# Patient Record
Sex: Male | Born: 1935 | Race: White | Hispanic: No | Marital: Married | Smoking: Never smoker
Health system: Southern US, Community
[De-identification: ages and names within clinical notes are randomized; demographics above are authoritative.]

## PROBLEM LIST (undated history)

## (undated) DIAGNOSIS — K37 Unspecified appendicitis: Secondary | ICD-10-CM

## (undated) DIAGNOSIS — C801 Malignant (primary) neoplasm, unspecified: Secondary | ICD-10-CM

## (undated) HISTORY — DX: Malignant (primary) neoplasm, unspecified: C80.1

## (undated) HISTORY — PX: VASECTOMY: SHX75

---

## 2017-06-10 ENCOUNTER — Ambulatory Visit (INDEPENDENT_AMBULATORY_CARE_PROVIDER_SITE_OTHER): Payer: Medicare Other | Admitting: Family Medicine

## 2017-06-10 ENCOUNTER — Encounter (INDEPENDENT_AMBULATORY_CARE_PROVIDER_SITE_OTHER): Payer: Self-pay

## 2017-06-10 ENCOUNTER — Encounter: Payer: Self-pay | Admitting: Family Medicine

## 2017-06-10 VITALS — BP 134/71 | HR 72 | Temp 97.8°F | Ht 73.0 in | Wt 180.0 lb

## 2017-06-10 DIAGNOSIS — C44101 Unspecified malignant neoplasm of skin of unspecified eyelid, including canthus: Secondary | ICD-10-CM | POA: Diagnosis not present

## 2017-06-10 DIAGNOSIS — L989 Disorder of the skin and subcutaneous tissue, unspecified: Secondary | ICD-10-CM | POA: Diagnosis not present

## 2017-06-10 DIAGNOSIS — H579 Unspecified disorder of eye and adnexa: Secondary | ICD-10-CM | POA: Diagnosis not present

## 2017-06-10 MED ORDER — ACETAMINOPHEN 500 MG PO TABS: 1000.0000 mg | ORAL_TABLET | Freq: Four times a day (QID) | ORAL | 0 refills | Status: DC | PRN

## 2017-06-10 NOTE — Progress Notes (Signed)
Patient ID: Tony [name], male    DOB: 01/16/1934, 83 y.o.   MRN: [id number]  Chief Complaint  Patient presents with  . Establish Care    new patient    Allergies Patient has no known allergies.  Subjective:   Tony [name] is a 83 y.o. male who presents to [facility] today.  HPI Here to establish care. Comes in today with his daughter, [name]. Reports that about 18 years ago, he went to a skin cancer screening at [facility] hospital. Was told by a doctor there that me probably had a skin cancer below his left eye. Since that time, he has not sought care or evaluation or biospy diagnosis for this condition. The area that was once small under his eye, has grown over the past 18 years to where he cannot see out of his eye and the area is ulcerated and bleeds and oozes. [name] reports that about six months ago,  His left eyeball started to flip upwards in his eye socket. Does not go out in public or leave the house much b/c of this. Wears a kleenex over the eye to cover up the lesion.   Was finally agreeable to daughter bringing him to the doctor b/c the pain has been bothering him at times. Reports that he has had sharp pain in the back of head on two occasions and at times can feel like has pressure over the crown of his head. He reports that he has been swollen over the left eye for two years. Not continuously swollen, per his report but intermittently. He might feels a burning sensation around eye but it is better if he keeps the kleenex moist with water over the eye. The eye/lesion occasionally drains, and he reports that the fluid is clear in color. Has never drained colored fluid and the skin around the eye has not been red.   Reports that his upper eye lid on the left is almost all gone now. Does not believe that has any eye lashes left and cannot make out his eyelid anymore. He reports that he tries to keep left eye brow trimmed. Can tell difference b/wbeing in the light and dark  with the left eye but is unable to see out of the left eye.   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  Reports that he does not lift weights anymore b/c the pressure makes his eye hurt. Stays active with fixing things around house and working on cars and lawnmowers.  Weight has been stable. Appetite is good.   Does not take any medications except an aspirin every now and then. NKDA.     Past Medical History:  Diagnosis Date  . Cancer ([place])    SUN CANCER---Right Eye    Past Surgical History:  Procedure Laterality Date  . VASECTOMY      Family History  Problem Relation Age of Onset  . Asthma Father      Social History   Social History  . Marital status: Married    Spouse name: N/A  . Number of children: N/A  . Years of education: N/A   Social History Main Topics  . Smoking status: Never Smoker  . Smokeless tobacco: Never Used  . Alcohol use No  . Drug use: No  . Sexual activity: No   Other Topics Concern  . None   Social History Narrative   Live in [place], [place]. Lives alone.  Cooks at home and eats out at times. Had three children, one is decreased.     Review of Systems  Constitutional: Negative for activity change, appetite change, chills and unexpected weight change.  HENT: Negative for drooling, ear discharge, ear pain, facial swelling, hearing loss, postnasal drip, sinus pressure, sneezing, sore throat, tinnitus, trouble swallowing and voice change.        Reports that tooth on the left side is a bit sore at times.   Eyes: Positive for photophobia, pain, discharge, redness and visual disturbance.  Respiratory: Positive for shortness of breath. Negative for cough, choking, chest tightness and wheezing.   Cardiovascular: Negative for chest pain, palpitations and leg swelling.  Gastrointestinal: Negative for abdominal pain, diarrhea, nausea, rectal pain and vomiting.  Endocrine: Negative for polydipsia and  polyphagia.  Genitourinary: Negative for decreased urine volume, dysuria, hematuria and urgency.  Musculoskeletal: Negative for arthralgias and myalgias.  Skin: Negative for pallor.  Neurological: Negative for dizziness, seizures, syncope, facial asymmetry, speech difficulty, weakness, light-headedness and headaches.  Hematological: Negative for adenopathy.  Psychiatric/Behavioral: Negative for agitation, behavioral problems, confusion, dysphoric mood and sleep disturbance. The patient is not nervous/anxious.      Objective:   BP 134/71   Pulse 72   Temp 97.8 F (36.6 C)   Ht 6\' 1"  (1.854 m)   Wt 180 lb (81.6 kg)   SpO2 97%   BMI 23.75 kg/m   Physical Exam  Constitutional: He is oriented to person, place, and time. He appears well-developed and well-nourished.  HENT:  Nose: Nose normal.  Eyes: Right eye exhibits no discharge. Left eye exhibits discharge and exudate.  Left eye grossly abnormal with ulcerated eye socket, appear to be no globe, but skin overgrowth. No left eye lid. Skin around left orbit ulcerated and friable. Almost appears as if globe is missing from eye socket. Orbital bones NTTP. Skin friable. Eye socket draining minimal serosanguinous fluid and edge of eye lesion friable. No odor from skin lesion.   Neck: Normal range of motion. Neck supple. No tracheal deviation present. No thyromegaly present.  Cardiovascular: Normal rate and regular rhythm.   Pulmonary/Chest: Effort normal and breath sounds normal.  Abdominal: Soft. Bowel sounds are normal. He exhibits no distension. There is no tenderness.  Musculoskeletal: Normal range of motion. He exhibits no edema or deformity.  Neurological: He is alert and oriented to person, place, and time.  Psychiatric: He has a normal mood and affect. His behavior is normal. Thought content normal. Cognition and memory are normal. Cognition and memory are not impaired. He exhibits normal recent memory and normal remote memory.    Pleasant man. Very slow talking. No cognitive deficit.   Vitals reviewed.    Assessment and Plan  1. Suspect Cancer of skin invaded into orbit/eye structures/face, ulcerated, growing for years without medical attention  2. Skin lesion, ulcerated, involving the eye and surrounding tissues, suspect malignancy  - CBC with Differential/Platelet - Basic metabolic panel - Hepatic function panel - INR/PT Patient presenting with no medical attention/evaluation/diagnosis for over 18 years, with ulcerated lesion gradually progressing to where it has invaded his orbit, surrounding skin, including eye lid/facial structures on left. Patient obviously has some reasons for not seeking medical attention, b/c appears to have normal cognitive function. Reports that he did not want to bother anyone and believed it was something that he could fix on his own. Daughter reports that he has had multiple appointments and cancelled. Finally agreeable to come to doctor today. I  do believe that patient does not know severity of his situation, b/c when asked about what he would like to accomplish with visits/evaluation, he reported that he would like to "save his eye".   Discussed with Dr. Talbert Cage. She recommended referral to dermatology for tissue diagnosis before referral to [facility]. She also recommended CT scan of head and neck to start evaluation. Dr. Talbert Cage said that she will have her nurse call me with the dermatologist that she believes will be the best.  Called back and recommended Dr. Lavonna Monarch. Urgent referral placed.  - Ambulatory referral to Dermatology - CT HEAD W & WO CONTRAST; Future  Pain mgmt: Patient is having minimal pain and has not taken anything except occasional aspirin. Discussed pain management with daughter and patient. At this time will try tylenol OTC as directed. Discontinue the aspirin due to bleeding risks of tissues.   Wound management: Continue with wet dressings to area as  directed. Patient will get gauze OTC or dressing. Patient believe that the kleenex is working fine. At this time, there appears to be no secondary infection.   Social aspects: Daughter reports that it has been very hard to get patient to accept medical help. Patient reports that he comes today to get help and he hopes that his eye can be preserved. I discussed with patient that I do not believe that he would have use of his eye, but that I would hope we could get him so help and treatment so that he could continue with his quality of life. He does not want this to inconvenience his daughter. She is very agreeable to getting him help and would like it to be as streamlined as possible with as few visits as possible. Patient agreeable to CT scan and I will do referral to [facility] and dermatology.   OV was greater than 70 minutes.   No Follow-up on file. [name], MD 06/10/2017

## 2017-06-11 ENCOUNTER — Encounter: Payer: Self-pay | Admitting: Family Medicine

## 2017-06-11 ENCOUNTER — Telehealth: Payer: Self-pay | Admitting: Family Medicine

## 2017-06-11 LAB — CBC WITH DIFFERENTIAL/PLATELET
BASOS ABS: 53 {cells}/uL (ref 0–200)
Basophils Relative: 0.8 %
EOS PCT: 1.7 %
Eosinophils Absolute: 112 cells/uL (ref 15–500)
HCT: 38.4 % — ABNORMAL LOW (ref 38.5–50.0)
Hemoglobin: 12.9 g/dL — ABNORMAL LOW (ref 13.2–17.1)
Lymphs Abs: 1584 cells/uL (ref 850–3900)
MCH: 28.9 pg (ref 27.0–33.0)
MCHC: 33.6 g/dL (ref 32.0–36.0)
MCV: 86.1 fL (ref 80.0–100.0)
MONOS PCT: 10.5 %
MPV: 9.4 fL (ref 7.5–12.5)
NEUTROS PCT: 63 %
Neutro Abs: 4158 cells/uL (ref 1500–7800)
Platelets: 187 10*3/uL (ref 140–400)
RBC: 4.46 10*6/uL (ref 4.20–5.80)
RDW: 12.7 % (ref 11.0–15.0)
TOTAL LYMPHOCYTE: 24 %
WBC mixed population: 693 cells/uL (ref 200–950)
WBC: 6.6 10*3/uL (ref 3.8–10.8)

## 2017-06-11 LAB — PROTIME-INR
INR: 1
PROTHROMBIN TIME: 10.6 s (ref 9.0–11.5)

## 2017-06-11 LAB — HEPATIC FUNCTION PANEL
AG RATIO: 1.3 (calc) (ref 1.0–2.5)
ALKALINE PHOSPHATASE (APISO): 65 U/L (ref 40–115)
ALT: 16 U/L (ref 9–46)
AST: 27 U/L (ref 10–35)
Albumin: 3.9 g/dL (ref 3.6–5.1)
BILIRUBIN DIRECT: 0.1 mg/dL (ref 0.0–0.2)
BILIRUBIN INDIRECT: 0.5 mg/dL (ref 0.2–1.2)
Globulin: 2.9 g/dL (calc) (ref 1.9–3.7)
Total Bilirubin: 0.6 mg/dL (ref 0.2–1.2)
Total Protein: 6.8 g/dL (ref 6.1–8.1)

## 2017-06-11 LAB — BASIC METABOLIC PANEL
BUN: 10 mg/dL (ref 7–25)
CALCIUM: 9.2 mg/dL (ref 8.6–10.3)
CHLORIDE: 102 mmol/L (ref 98–110)
CO2: 31 mmol/L (ref 20–32)
CREATININE: 1.06 mg/dL (ref 0.70–1.11)
Glucose, Bld: 96 mg/dL (ref 65–139)
POTASSIUM: 4.6 mmol/L (ref 3.5–5.3)
SODIUM: 139 mmol/L (ref 135–146)

## 2017-06-11 NOTE — Telephone Encounter (Signed)
Advise that he was not prescribed a presciption medication but was told to use OTC tylenol as directed. See orders.

## 2017-06-11 NOTE — Telephone Encounter (Signed)
I called and spoke to Tony [name], states he figured out he was to get otc tylenol, and took one last night and he slept 6 hours. He is asking about his lab results and if he needs to go for a ct/mri OR a dermatology consult.Marland KitchenMarland KitchenMarland Kitchen

## 2017-06-11 NOTE — Telephone Encounter (Signed)
Tyler from [facility] left a msg on the machine at 4:54 yesterday that patient was there to pickup medication, but they had no script. Cb#: [phone]

## 2017-06-12 ENCOUNTER — Encounter: Payer: Self-pay | Admitting: Family Medicine

## 2017-06-12 ENCOUNTER — Telehealth: Payer: Self-pay | Admitting: Family Medicine

## 2017-06-12 NOTE — Telephone Encounter (Signed)
Please call Dr. Onalee Hua office and find out when I can speak with him on the phone or who I need to talk with to get patient an appointment before January. He needs appointment ASAP.

## 2017-06-12 NOTE — Telephone Encounter (Signed)
Please call daughter and advise that all of the labs looked within normal limits.  He is mildly anemic but otherwise, kidney tests, electrolytes, and liver tests within normal limits.  Advise that I have done referrals for:  CT scans, dermatology and [place] at [facility]. He can get the ct scans and see the dermatologist and then go to the cancer center.  I am glad that the tylenol worked well for him.  [name], MD

## 2017-06-15 ENCOUNTER — Telehealth: Payer: Self-pay | Admitting: Family Medicine

## 2017-06-15 NOTE — Telephone Encounter (Signed)
All communicated in MyChart messages.

## 2017-06-15 NOTE — Telephone Encounter (Signed)
Please call patient and advise that he needs to go to the ED STAT. [name], MD

## 2017-06-15 NOTE — Telephone Encounter (Signed)
Called Dr. Onalee Hua office and gave Dr. Roma Kayser contact information for him to call.

## 2017-06-16 ENCOUNTER — Telehealth: Payer: Self-pay | Admitting: Family Medicine

## 2017-06-16 NOTE — Telephone Encounter (Signed)
error 

## 2017-06-16 NOTE — Telephone Encounter (Signed)
Daughter informed and asked that the information be conveyed through [place] as well, which I have also done. I have reached out to Dr Talbert Cage and an waiting on a response

## 2017-06-16 NOTE — Telephone Encounter (Signed)
Please call  [name] and see how he is doing and see if they went to the ED for evaluation. See if he is still having bleeding. Tell her we have reached out to Dr. [place] office and are awaiting a call back from him.  Please advise that I do not believe that the bleeding was caused by the tylenol, but rather from the lesion itself. Do not use the aspirin, we had stopped it at the visit. Aspirin can increase his bleeding risk.    Please call Dr. Talbert Cage and ask for another dermatologic surgery recommendation. Dr. Denna Haggard office cannot get patient in until later in January.    [name], MD

## 2017-06-17 ENCOUNTER — Telehealth: Payer: Self-pay | Admitting: Family Medicine

## 2017-06-17 NOTE — Telephone Encounter (Signed)
Please call patient's daughter and advise that I spoke with Dr. Denna Haggard and he will see them tomorrow at 2:00 as a work in appointment to do the biopsy. Please advise they need to keep this appointment. It is at [facility] at 2:00 [employer] in [place].

## 2017-06-17 NOTE — Telephone Encounter (Signed)
Dr Mannie Stabile spoke with Dr. Denna Haggard. He will see patient as a work in Architectural technologist at [facility] at 2 pm, and will perform biopsy. Left message on daughter's phone.

## 2017-06-17 NOTE — Telephone Encounter (Signed)
Called Dr.Tafeen's office at 2:40 to check the status of a referral for a patient. I also let the male receptionist know that Dr.Hagler left a message a few days ago requesting a phone call from [place] and he never called. I gave him the doctors phone line to call in and the receptionist said he will leave a message. I will follow back up with this in the morning.

## 2017-06-18 NOTE — Telephone Encounter (Signed)
I called yesterday afternoon and left message with daughter, but also sent on MyChart. Received MyChart message of confirmation this morning.

## 2017-06-23 ENCOUNTER — Telehealth: Payer: Self-pay | Admitting: *Deleted

## 2017-06-23 NOTE — Telephone Encounter (Signed)
Patient's daughter called wanting to know if patient's biopsy's came back that Dr Denna Haggard did, I made her aware that Dr Denna Haggard will have to give her those results, the daughter stated she will call Dr Denna Haggard but she wants the nurse to call her to make sure Dr Denna Haggard has sent over the results. Patient is scheduled for 06/25/17 at 8:20 to follow up from CT scan that is scheduled for tomorrow 06/24/17. Please advise

## 2017-06-23 NOTE — Telephone Encounter (Signed)
Please see if you can get results. I have not received yet. You can advise them of this. [name], MD

## 2017-06-24 ENCOUNTER — Ambulatory Visit (HOSPITAL_COMMUNITY)
Admission: RE | Admit: 2017-06-24 | Discharge: 2017-06-24 | Disposition: A | Discharge status: Home / Self Care | Payer: Medicare Other | Source: Ambulatory Visit | Attending: Family Medicine | Admitting: Family Medicine

## 2017-06-24 DIAGNOSIS — I672 Cerebral atherosclerosis: Secondary | ICD-10-CM | POA: Insufficient documentation

## 2017-06-24 DIAGNOSIS — C44101 Unspecified malignant neoplasm of skin of unspecified eyelid, including canthus: Secondary | ICD-10-CM | POA: Diagnosis present

## 2017-06-24 MED ORDER — IOPAMIDOL (ISOVUE-300) INJECTION 61%
80.0000 mL | Freq: Once | INTRAVENOUS | Status: AC | PRN
  Administered 2017-06-24: 75 mL via INTRAVENOUS

## 2017-06-24 NOTE — Telephone Encounter (Signed)
I called Dr Onalee Hua office, Dr Denna Haggard has not reviewed the results as of now. Once he reviews they will fax over results. I called and made patient's daughter aware.

## 2017-06-25 ENCOUNTER — Ambulatory Visit (INDEPENDENT_AMBULATORY_CARE_PROVIDER_SITE_OTHER): Payer: Medicare Other | Admitting: Family Medicine

## 2017-06-25 ENCOUNTER — Encounter: Payer: Self-pay | Admitting: Family Medicine

## 2017-06-25 VITALS — BP 130/75 | HR 67 | Temp 97.4°F | Resp 16 | Ht 73.0 in | Wt 186.8 lb

## 2017-06-25 DIAGNOSIS — C44111 Basal cell carcinoma of skin of unspecified eyelid, including canthus: Secondary | ICD-10-CM | POA: Diagnosis not present

## 2017-06-25 DIAGNOSIS — E0789 Other specified disorders of thyroid: Secondary | ICD-10-CM | POA: Diagnosis not present

## 2017-06-25 DIAGNOSIS — C4491 Basal cell carcinoma of skin, unspecified: Secondary | ICD-10-CM | POA: Insufficient documentation

## 2017-06-25 LAB — TSH: TSH: 3.37 mIU/L (ref 0.40–4.50)

## 2017-06-25 NOTE — Progress Notes (Signed)
Patient ID: Tony [name], male    DOB: [birth year], 83 y.o.   MRN: [id number]  Chief Complaint  Patient presents with  . Follow-up    Allergies Patient has no known allergies.  Subjective:   Tony [name] is a 83 y.o. male who presents to [facility] today.  HPI Tony [name] presents today for a follow-up visit. He is accompanied today by his daughter for review/discussion of his CT scans which were performed yesterday. They report that they were seen by Dr.Tafeen on 06/18/2017 for biopsy of the skin lesion on his face/orbit. His daughter reports that she was called by Dr. Montine Circle office yesterday and was told the pathology result, but she reports she could not completely understand what she was told. She requests for Tony [name] to get a copy of the pathology report so that it can be explained and reviewed with him today.  Tony [name] reports that his pain has been well controlled. He is used Tylenol 500 mg, half a tablet, on 2 occasions. He reports that the Tylenol has taken away his pain and allow him to sleep better at night. He reports that the lesion is still draining fluid which can be clear or slightly bloody in quality. It has not been draining any more than it was draining last time. Denies any fever, chills, nausea, vomiting, diarrhea, or problems. He reports that he feels very well. He would like to go over the results of the CT scan and reports he would like to get his treatment started.    Past Medical History:  Diagnosis Date  . Cancer ([place])    SUN CANCER---Right Eye    Past Surgical History:  Procedure Laterality Date  . VASECTOMY      Family History  Problem Relation Age of Onset  . Asthma Father      Social History   Social History  . Marital status: Married    Spouse name: N/A  . Number of children: N/A  . Years of education: N/A   Social History Main Topics  . Smoking status: Never Smoker  . Smokeless tobacco: Never Used  . Alcohol use No  .  Drug use: No  . Sexual activity: No   Other Topics Concern  . None   Social History Narrative   Live in [place], [place]. Lives alone. Cooks at home and eats out at times. Had three children, one is decreased.     Review of Systems  Constitutional: Negative for appetite change, chills, diaphoresis, fatigue, fever and unexpected weight change.  HENT: Negative for ear discharge, ear pain, mouth sores, postnasal drip, rhinorrhea, sinus pain, sinus pressure, sneezing, sore throat, trouble swallowing and voice change.   Respiratory: Negative for apnea, cough, chest tightness and shortness of breath.   Cardiovascular: Negative for chest pain, palpitations and leg swelling.  Gastrointestinal: Negative for constipation, diarrhea and nausea.  Endocrine: Negative for polyphagia and polyuria.  Genitourinary: Negative for difficulty urinating, dysuria, hematuria and urgency.  Neurological: Negative for tremors, syncope, weakness, light-headedness and numbness.  Psychiatric/Behavioral: Negative for agitation, dysphoric mood and sleep disturbance. The patient is not nervous/anxious.      Objective:   BP 130/75 (BP Location: Left Arm, Patient Position: Sitting, Cuff Size: Normal)   Pulse 67   Temp (!) 97.4 F (36.3 C) (Other (Comment))   Resp 16   Ht 6\' 1"  (1.854 m)   Wt 186 lb 12 oz (84.7 kg)   SpO2 98%   BMI 24.64 kg/m  Physical Exam  Constitutional: He is oriented to person, place, and time. He appears well-developed and well-nourished. No distress.  HENT:  Mouth/Throat: Oropharynx is clear and moist.  Eyes: Right conjunctiva is not injected. Right conjunctiva has no hemorrhage.  Left eye covered with tissue today.  Neck: Normal range of motion. Neck supple. No JVD present. No tracheal deviation present. No thyromegaly present.  No thyroid lesion palpated on exam.  Cardiovascular: Normal rate and regular rhythm.   Pulmonary/Chest: Effort normal and breath sounds normal. No respiratory  distress.  Neurological: He is alert and oriented to person, place, and time.  Skin: Skin is warm and dry. He is not diaphoretic.  Nursing note and vitals reviewed.    Assessment and Plan   1. Basal cell carcinoma (BCC) of skin of left eyelid including canthus, unspecified eyelid Received records via fax. - Ambulatory referral to Dermatology placed Pathology report reviewed which reveals basal cell carcinoma, nodular pattern and an ulcerated basal cell carcinoma on the left side of his nose. CT scan of the head and soft tissues of the neck reviewed with patient and his daughter in great detail. All of their questions were answered and all aspects of the report discussed. Called Dr.Tafeen's office and they requested that we go ahead and contact Dr.Williford's office at [facility]. The office requested that we send over notes and pathology results for his review prior to and office visit being scheduled. This has been completed. Daughter and patient told if they did not hear back regarding an appointment within the next week to please call our office.  Regarding pain management, patient will continue with the Tylenol use as directed. He will avoid use of aspirin. We reviewed his blood work from his last visit today. He was slightly/mildly anemic. At this time will start a Centrum silver vitamin with iron. In addition patient is going to make attempts to improve his diet. They were told to please call with any questions or concerns, if his pain worsen, or if the site began to bleed or ooze more than normal 2. Thyroid mass of unclear etiology CT scan revealing 4 x 5 cm thyroid mass. At this time will obtain thyroid ultrasound and fine needle aspirate. Scheduled this today. - TSH - Tony [name] THYROID FNA EACH NODULE; Future  Office visit was greater than 45 minutes. Greater than 50% of time spent counseling and coordinating care. Time was spent reviewing pathology and CT  scans and explaining course of action/treatment. Patient defers flu shot and any pneumonia shots. If he changes his mind he will let Tony [name] know.  Return in about 4 weeks (around 07/23/2017) for follow up. [name], MD 06/25/2017

## 2017-06-29 ENCOUNTER — Encounter: Payer: Self-pay | Admitting: Family Medicine

## 2017-06-29 DIAGNOSIS — E079 Disorder of thyroid, unspecified: Secondary | ICD-10-CM

## 2017-06-29 NOTE — Telephone Encounter (Signed)
Please call patient and see what is going on and if we need to make a referral and to whom. Thryoid u/s and FNA order placed. I replaced thyroid u/s. Please make sure has appointments/infor to get appointments. [name], MD

## 2017-07-01 ENCOUNTER — Encounter: Payer: Self-pay | Admitting: Family Medicine

## 2017-07-02 ENCOUNTER — Other Ambulatory Visit (HOSPITAL_COMMUNITY): Payer: Medicare Other

## 2017-07-02 ENCOUNTER — Ambulatory Visit (HOSPITAL_COMMUNITY): Admission: RE | Admit: 2017-07-02 | Payer: Medicare Other | Source: Ambulatory Visit

## 2017-07-06 ENCOUNTER — Ambulatory Visit (HOSPITAL_COMMUNITY)
Admission: RE | Admit: 2017-07-06 | Discharge: 2017-07-06 | Disposition: A | Discharge status: Home / Self Care | Payer: Medicare Other | Source: Ambulatory Visit | Attending: Family Medicine | Admitting: Family Medicine

## 2017-07-06 DIAGNOSIS — E041 Nontoxic single thyroid nodule: Secondary | ICD-10-CM | POA: Insufficient documentation

## 2017-07-06 DIAGNOSIS — E079 Disorder of thyroid, unspecified: Secondary | ICD-10-CM | POA: Insufficient documentation

## 2017-07-08 ENCOUNTER — Ambulatory Visit (HOSPITAL_COMMUNITY): Admission: RE | Admit: 2017-07-08 | Payer: Medicare Other | Source: Ambulatory Visit

## 2017-07-08 ENCOUNTER — Encounter (HOSPITAL_COMMUNITY): Payer: Self-pay

## 2017-07-08 ENCOUNTER — Ambulatory Visit (HOSPITAL_COMMUNITY)
Admission: RE | Admit: 2017-07-08 | Discharge: 2017-07-08 | Disposition: A | Discharge status: Home / Self Care | Payer: Medicare Other | Source: Ambulatory Visit | Attending: Family Medicine | Admitting: Family Medicine

## 2017-07-08 DIAGNOSIS — E0789 Other specified disorders of thyroid: Secondary | ICD-10-CM

## 2017-07-08 DIAGNOSIS — E041 Nontoxic single thyroid nodule: Secondary | ICD-10-CM | POA: Diagnosis present

## 2017-07-08 MED ORDER — LIDOCAINE HCL (PF) 2 % IJ SOLN
INTRAMUSCULAR | Status: AC
  Filled 2017-07-08: qty 10

## 2017-07-08 NOTE — Discharge Instructions (Signed)
Thyroid Biopsy °The thyroid gland is a butterfly-shaped gland located in the front of the neck. It produces hormones that affect metabolism, growth and development, and body temperature. Thyroid biopsy is a procedure in which small samples of tissue or fluid are removed from the thyroid gland. The samples are then looked at under a microscope to check for abnormalities. This procedure is done to determine the cause of thyroid problems. It may be done to check for infection, cancer, or other thyroid problems. °Two methods may be used for a thyroid biopsy. In one method, a thin needle is inserted through the skin and into the thyroid gland. In the other method, an open incision is made through the skin. °Tell a health care provider about: °· Any allergies you have. °· All medicines you are taking, including vitamins, herbs, eye drops, creams, and over-the-counter medicines. °· Any problems you or family members have had with anesthetic medicines. °· Any blood disorders you have. °· Any surgeries you have had. °· Any medical conditions you have. °What are the risks? °Generally, this is a safe procedure. However, problems can occur and include: °· Bleeding from the procedure site. °· Infection. °· Injury to structures near the thyroid gland. ° °What happens before the procedure? °· Ask your health care provider about: °? Changing or stopping your regular medicines. This is especially important if you are taking diabetes medicines or blood thinners. °? Taking medicines such as aspirin and ibuprofen. These medicines can thin your blood. Do not take these medicines before your procedure if your health care provider asks you not to. °· Do not eat or drink anything after midnight on the night before the procedure or as directed by your health care provider. °· You may have a blood sample taken. °What happens during the procedure? °Either of these methods may be used to perform a thyroid biopsy: °· Fine needle biopsy. You may  be given medicine to help you relax (sedative). You will be asked to lie on your back with your head tipped backward to extend your neck. An area on your neck will be cleaned. A needle will then be inserted through the skin of your neck. You may be asked to avoid coughing, talking, swallowing, or making sounds during some portions of the procedure. The needle will be withdrawn once the tissue or fluid samples have been removed. Pressure may be applied to your neck to reduce swelling and ensure that bleeding has stopped. The samples will be sent to a lab for examination. °· Open biopsy. You will be given medicine to make you sleep (general anesthetic). An incision will be made in your neck. A sample of thyroid tissue will be removed using surgical tools. The tissue sample will be sent for examination. In some cases, the sample may be examined during the biopsy. If that is done and cancer cells are found, some or all of the thyroid gland may be removed. The incision will be closed with stitches. ° °What happens after the procedure? °· Your recovery will be assessed and monitored. °· You may have soreness and tenderness at the site of the biopsy. This should go away after a few days. °· If you had an open biopsy, you may have a hoarse voice or sore throat for a couple days. °· It is your responsibility to get your test results. °This information is not intended to replace advice given to you by your health care provider. Make sure you discuss any questions you have with   your health care provider. °Document Released: 06/08/2007 Document Revised: 04/13/2016 Document Reviewed: 11/03/2013 °Elsevier Interactive Patient Education © 2018 [employer] ° °

## 2017-07-09 ENCOUNTER — Encounter: Payer: Self-pay | Admitting: Family Medicine

## 2017-07-14 ENCOUNTER — Encounter: Payer: Self-pay | Admitting: Family Medicine

## 2017-07-21 NOTE — Telephone Encounter (Signed)
Please check on status of biopsy results. Please call Tony [name] and ask him if he would be willing to come in and talk with me.  [name], MD

## 2017-07-26 NOTE — Telephone Encounter (Signed)
Please get the biopsy results. [name], MD

## 2017-07-28 NOTE — Telephone Encounter (Signed)
Please call patient and ask to please follow-up to discuss biopsy report and further evaluation and treatment of his condition.  In addition please call patient's daughter and asked him to follow-up to discuss her father's treatment and biopsy results.  You may advised them both that the biopsy results were worrisome for cancer, but more tissue is needed to determine this.  Please ask them to follow-up to discuss. [name], MD\

## 2017-07-28 NOTE — Telephone Encounter (Signed)
Called patient regarding message below. No answer, left generic message for patient to return call.   Left message with daughter as well

## 2017-11-06 ENCOUNTER — Encounter: Payer: Self-pay | Admitting: Family Medicine

## 2017-11-09 ENCOUNTER — Telehealth: Payer: Self-pay | Admitting: Family Medicine

## 2017-11-09 NOTE — Telephone Encounter (Signed)
Daughter is also questioning his thyroid testing results. She says the only thing she receives is the procedures. She wants diagnosis.

## 2017-11-09 NOTE — Telephone Encounter (Signed)
Patients daughter called this morning to check and make sure you received her mychart message. Please review and call her back to the number below. Cb#: [phone]

## 2017-11-10 NOTE — Telephone Encounter (Signed)
I have spoken to his daughter. I read her all information below. She states she will speak to her father and see what he would like to do

## 2017-11-10 NOTE — Telephone Encounter (Signed)
Please call patient's daughter and advised that I did receive the message that she sent yesterday.  Advised her that I was off work yesterday and just got back today.  Advise her that her father's thyroid function/blood work was within normal limits.  This did indicate that he was not in need of thyroid supplementation at that time.  His thyroid biopsy was worrisome for malignancy and it was recommended that he have more biopsy specimens performed.  I understand that your father does not want any treatment for his skin cancer or his thyroid.  It is very difficult for me to be able to offer you something that will help him to "live out his days in peace"  Without him even coming into the office to talk with me.  I understand that he is very hesitant about taking medications and about visiting doctors and having tests performed.  When I last saw him his pain was controlled with Tylenol.  If this is changed substantially it would worry me that the tumor has invaded more of the surrounding tissue which would be causing him pain and bleeding.  There are several things that we could do.  I would like free to suggest that he come sit down and talk with me about these options.  However, if he is unwilling to do that, I could have hospice come out and do an evaluation and offer him treatment from a home-based situation.  Please advise her to let me know how she would like to proceed.

## 2017-11-11 ENCOUNTER — Encounter: Payer: Self-pay | Admitting: Family Medicine

## 2017-11-13 ENCOUNTER — Telehealth: Payer: Self-pay | Admitting: Family Medicine

## 2017-11-13 NOTE — Telephone Encounter (Signed)
Daughter notified 

## 2017-11-13 NOTE — Telephone Encounter (Signed)
Please call patient's daughter and advised that if he is having the symptoms that she has noted in the my chart of difficulty breathing, abdominal pain in the things listed.  He needs to be seen and evaluated in the emergency department at this time.  This would be my recommendation.  I am not comfortable ordering a chest x-ray for him to get it a future date when he is having symptoms that are this worrisome at this time.  Please advise them that I would recommend that he go to the emergency department for acute evaluation and management.

## 2017-11-17 ENCOUNTER — Telehealth: Payer: Self-pay | Admitting: Family Medicine

## 2017-11-17 NOTE — Telephone Encounter (Signed)
Please find out if he is going to come in in the near future to discuss his care with me. Did they agree to Hospice?

## 2017-11-17 NOTE — Telephone Encounter (Signed)
Cassandra, [facility], left message on nurse line asking if Dr Mannie Stabile would be primary on Hospice and sign certification of prognosis of 6 months or less.   Callback# [phone]

## 2017-11-19 NOTE — Telephone Encounter (Signed)
Called patient regarding message below. No answer, left generic message for patient to return call.   

## 2017-11-20 ENCOUNTER — Encounter: Payer: Self-pay | Admitting: Family Medicine

## 2017-11-23 ENCOUNTER — Inpatient Hospital Stay (HOSPITAL_COMMUNITY)
Admission: EM | Admit: 2017-11-23 | Discharge: 2017-11-26 | DRG: 291 | Disposition: A | Discharge status: Hospice | Payer: Medicare Other | Attending: Internal Medicine | Admitting: Internal Medicine

## 2017-11-23 ENCOUNTER — Inpatient Hospital Stay (HOSPITAL_COMMUNITY): Payer: Medicare Other

## 2017-11-23 ENCOUNTER — Encounter (HOSPITAL_COMMUNITY): Payer: Self-pay | Admitting: Emergency Medicine

## 2017-11-23 ENCOUNTER — Emergency Department (HOSPITAL_COMMUNITY): Payer: Medicare Other

## 2017-11-23 ENCOUNTER — Other Ambulatory Visit: Payer: Self-pay

## 2017-11-23 DIAGNOSIS — I361 Nonrheumatic tricuspid (valve) insufficiency: Secondary | ICD-10-CM | POA: Diagnosis not present

## 2017-11-23 DIAGNOSIS — Z7982 Long term (current) use of aspirin: Secondary | ICD-10-CM | POA: Diagnosis not present

## 2017-11-23 DIAGNOSIS — I700 Atherosclerosis of aorta: Secondary | ICD-10-CM | POA: Diagnosis present

## 2017-11-23 DIAGNOSIS — I130 Hypertensive heart and chronic kidney disease with heart failure and stage 1 through stage 4 chronic kidney disease, or unspecified chronic kidney disease: Secondary | ICD-10-CM | POA: Diagnosis present

## 2017-11-23 DIAGNOSIS — I255 Ischemic cardiomyopathy: Secondary | ICD-10-CM | POA: Diagnosis present

## 2017-11-23 DIAGNOSIS — Z515 Encounter for palliative care: Secondary | ICD-10-CM

## 2017-11-23 DIAGNOSIS — N1330 Unspecified hydronephrosis: Secondary | ICD-10-CM | POA: Diagnosis present

## 2017-11-23 DIAGNOSIS — D508 Other iron deficiency anemias: Secondary | ICD-10-CM | POA: Diagnosis not present

## 2017-11-23 DIAGNOSIS — I4891 Unspecified atrial fibrillation: Secondary | ICD-10-CM | POA: Diagnosis not present

## 2017-11-23 DIAGNOSIS — N400 Enlarged prostate without lower urinary tract symptoms: Secondary | ICD-10-CM | POA: Diagnosis present

## 2017-11-23 DIAGNOSIS — E876 Hypokalemia: Secondary | ICD-10-CM | POA: Diagnosis present

## 2017-11-23 DIAGNOSIS — R778 Other specified abnormalities of plasma proteins: Secondary | ICD-10-CM | POA: Diagnosis present

## 2017-11-23 DIAGNOSIS — Z9889 Other specified postprocedural states: Secondary | ICD-10-CM

## 2017-11-23 DIAGNOSIS — N179 Acute kidney failure, unspecified: Secondary | ICD-10-CM | POA: Diagnosis present

## 2017-11-23 DIAGNOSIS — D631 Anemia in chronic kidney disease: Secondary | ICD-10-CM | POA: Diagnosis present

## 2017-11-23 DIAGNOSIS — F419 Anxiety disorder, unspecified: Secondary | ICD-10-CM | POA: Diagnosis present

## 2017-11-23 DIAGNOSIS — J90 Pleural effusion, not elsewhere classified: Secondary | ICD-10-CM

## 2017-11-23 DIAGNOSIS — Z85828 Personal history of other malignant neoplasm of skin: Secondary | ICD-10-CM | POA: Diagnosis not present

## 2017-11-23 DIAGNOSIS — N139 Obstructive and reflux uropathy, unspecified: Secondary | ICD-10-CM | POA: Diagnosis not present

## 2017-11-23 DIAGNOSIS — D62 Acute posthemorrhagic anemia: Secondary | ICD-10-CM | POA: Diagnosis not present

## 2017-11-23 DIAGNOSIS — I5021 Acute systolic (congestive) heart failure: Secondary | ICD-10-CM | POA: Diagnosis present

## 2017-11-23 DIAGNOSIS — Z7189 Other specified counseling: Secondary | ICD-10-CM

## 2017-11-23 DIAGNOSIS — I440 Atrioventricular block, first degree: Secondary | ICD-10-CM | POA: Diagnosis present

## 2017-11-23 DIAGNOSIS — Z66 Do not resuscitate: Secondary | ICD-10-CM | POA: Diagnosis present

## 2017-11-23 DIAGNOSIS — R339 Retention of urine, unspecified: Secondary | ICD-10-CM | POA: Diagnosis present

## 2017-11-23 DIAGNOSIS — T45515A Adverse effect of anticoagulants, initial encounter: Secondary | ICD-10-CM | POA: Diagnosis present

## 2017-11-23 DIAGNOSIS — I429 Cardiomyopathy, unspecified: Secondary | ICD-10-CM | POA: Diagnosis not present

## 2017-11-23 DIAGNOSIS — D649 Anemia, unspecified: Secondary | ICD-10-CM | POA: Diagnosis not present

## 2017-11-23 DIAGNOSIS — C6982 Malignant neoplasm of overlapping sites of left eye and adnexa: Secondary | ICD-10-CM | POA: Diagnosis present

## 2017-11-23 DIAGNOSIS — R7989 Other specified abnormal findings of blood chemistry: Secondary | ICD-10-CM

## 2017-11-23 DIAGNOSIS — N182 Chronic kidney disease, stage 2 (mild): Secondary | ICD-10-CM | POA: Diagnosis present

## 2017-11-23 DIAGNOSIS — Z91048 Other nonmedicinal substance allergy status: Secondary | ICD-10-CM

## 2017-11-23 DIAGNOSIS — R310 Gross hematuria: Secondary | ICD-10-CM | POA: Diagnosis present

## 2017-11-23 DIAGNOSIS — I248 Other forms of acute ischemic heart disease: Secondary | ICD-10-CM | POA: Diagnosis present

## 2017-11-23 DIAGNOSIS — R748 Abnormal levels of other serum enzymes: Secondary | ICD-10-CM

## 2017-11-23 DIAGNOSIS — C73 Malignant neoplasm of thyroid gland: Secondary | ICD-10-CM | POA: Diagnosis present

## 2017-11-23 DIAGNOSIS — R42 Dizziness and giddiness: Secondary | ICD-10-CM

## 2017-11-23 DIAGNOSIS — I340 Nonrheumatic mitral (valve) insufficiency: Secondary | ICD-10-CM | POA: Diagnosis not present

## 2017-11-23 HISTORY — DX: Unspecified appendicitis: K37

## 2017-11-23 LAB — LIPASE, BLOOD: LIPASE: 27 U/L (ref 11–51)

## 2017-11-23 LAB — CBC WITH DIFFERENTIAL/PLATELET
BASOS PCT: 0 %
Basophils Absolute: 0 10*3/uL (ref 0.0–0.1)
EOS ABS: 0.1 10*3/uL (ref 0.0–0.7)
Eosinophils Relative: 1 %
HCT: 29.4 % — ABNORMAL LOW (ref 39.0–52.0)
HEMOGLOBIN: 9.2 g/dL — AB (ref 13.0–17.0)
LYMPHS ABS: 1.5 10*3/uL (ref 0.7–4.0)
Lymphocytes Relative: 16 %
MCH: 28.8 pg (ref 26.0–34.0)
MCHC: 31.3 g/dL (ref 30.0–36.0)
MCV: 92.2 fL (ref 78.0–100.0)
MONOS PCT: 11 %
Monocytes Absolute: 1 10*3/uL (ref 0.1–1.0)
NEUTROS PCT: 72 %
Neutro Abs: 6.6 10*3/uL (ref 1.7–7.7)
PLATELETS: 203 10*3/uL (ref 150–400)
RBC: 3.19 MIL/uL — ABNORMAL LOW (ref 4.22–5.81)
RDW: 14 % (ref 11.5–15.5)
WBC: 9.2 10*3/uL (ref 4.0–10.5)

## 2017-11-23 LAB — BASIC METABOLIC PANEL
Anion gap: 11 (ref 5–15)
BUN: 37 mg/dL — AB (ref 6–20)
CALCIUM: 8.7 mg/dL — AB (ref 8.9–10.3)
CO2: 29 mmol/L (ref 22–32)
CREATININE: 4.59 mg/dL — AB (ref 0.61–1.24)
Chloride: 99 mmol/L — ABNORMAL LOW (ref 101–111)
GFR, EST AFRICAN AMERICAN: 13 mL/min — AB (ref >60)
GFR, EST NON AFRICAN AMERICAN: 11 mL/min — AB (ref >60)
Glucose, Bld: 105 mg/dL — ABNORMAL HIGH (ref 65–99)
Potassium: 3.2 mmol/L — ABNORMAL LOW (ref 3.5–5.1)
SODIUM: 139 mmol/L (ref 135–145)

## 2017-11-23 LAB — HEPATIC FUNCTION PANEL
ALT: 18 U/L (ref 17–63)
AST: 26 U/L (ref 15–41)
Albumin: 3.2 g/dL — ABNORMAL LOW (ref 3.5–5.0)
Alkaline Phosphatase: 50 U/L (ref 38–126)
BILIRUBIN DIRECT: 0.1 mg/dL (ref 0.1–0.5)
BILIRUBIN INDIRECT: 0.7 mg/dL (ref 0.3–0.9)
BILIRUBIN TOTAL: 0.8 mg/dL (ref 0.3–1.2)
Total Protein: 7 g/dL (ref 6.5–8.1)

## 2017-11-23 LAB — TROPONIN I
Troponin I: 0.17 ng/mL (ref <0.03)
Troponin I: 0.15 ng/mL (ref <0.03)

## 2017-11-23 LAB — SEDIMENTATION RATE: Sed Rate: 65 mm/hr — ABNORMAL HIGH (ref 0–16)

## 2017-11-23 LAB — BRAIN NATRIURETIC PEPTIDE: B NATRIURETIC PEPTIDE 5: 1042 pg/mL — AB (ref 0.0–100.0)

## 2017-11-23 MED ORDER — ACETAMINOPHEN 325 MG PO TABS: 650.0000 mg | ORAL_TABLET | Freq: Four times a day (QID) | ORAL | Status: DC | PRN

## 2017-11-23 MED ORDER — ACETAMINOPHEN 650 MG RE SUPP
650.0000 mg | Freq: Four times a day (QID) | RECTAL | Status: DC | PRN
Start: 2017-11-23 — End: 2017-11-26

## 2017-11-23 MED ORDER — ORAL CARE MOUTH RINSE
15.0000 mL | Freq: Two times a day (BID) | OROMUCOSAL | Status: DC
  Administered 2017-11-25 (×2): 15 mL via OROMUCOSAL

## 2017-11-23 MED ORDER — HEPARIN SODIUM (PORCINE) 5000 UNIT/ML IJ SOLN
5000.0000 [IU] | Freq: Three times a day (TID) | INTRAMUSCULAR | Status: DC
  Administered 2017-11-23: 5000 [IU] via SUBCUTANEOUS
  Filled 2017-11-23: qty 1

## 2017-11-23 MED ORDER — CHLORHEXIDINE GLUCONATE 0.12 % MT SOLN
15.0000 mL | Freq: Two times a day (BID) | OROMUCOSAL | Status: DC
  Administered 2017-11-23 – 2017-11-25 (×3): 15 mL via OROMUCOSAL
  Filled 2017-11-23 (×6): qty 15

## 2017-11-23 MED ORDER — TRAMADOL HCL 50 MG PO TABS
50.0000 mg | ORAL_TABLET | Freq: Four times a day (QID) | ORAL | Status: DC | PRN
  Administered 2017-11-23: 50 mg via ORAL
  Filled 2017-11-23: qty 1

## 2017-11-23 MED ORDER — SODIUM CHLORIDE 0.9 % IV SOLN
INTRAVENOUS | Status: DC
  Administered 2017-11-23: 23:00:00 via INTRAVENOUS

## 2017-11-23 NOTE — ED Notes (Signed)
Assisted patient with use of urinal and placed him back in bed.

## 2017-11-23 NOTE — ED Notes (Signed)
Have given EKG to doctor

## 2017-11-23 NOTE — H&P (Signed)
[facility] H&P   Patient Demographics:    Tony [name], is a 84 y.o. male  MRN: [id number]   DOB - 11/07/1933  Admit Date - 11/23/2017  Outpatient Primary MD for the patient is [name], MD  Referring MD/NP/PA: Tanna Furry  Outpatient Specialists:     Patient coming from:  home  Chief Complaint  Patient presents with  . Nausea  . Weakness  . Shortness of Breath      HPI:    Tony [name]  is a 84 y.o. male, w basal cell carcinoma (face), and ? Thyroid cancer apparently c/o dizziness and therefore presented to ED. Dizziness (vertigo), has been going on for the past 3 weeks.  Pt had MRI brain at [facility] per pt.  Pt also notes slight lower abdominal discomfort for the past 3 weeks as well.  Pt denies NSAID use.   Pt on presentation to ED, found to be in ARF.     In ED,  Xray IMPRESSION: The bowel gas pattern suggests a gastroenteritis or ileus type pattern. There is no evidence of perforation or high-grade obstruction.  Interstitial changes throughout both lungs which may be acute or chronic. There is bibasilar atelectasis or pneumonia. There are small bilateral pleural effusions of uncertain age. The heart and pulmonary vascularity are within the limits of normal.  Thoracic aortic atherosclerosis.  Trop I 0.17 Wbc 9.2, Hgb 9.2, Plt 203 Na 139, K 3.2, Bun 37, Creatinine 4.59 BNP 1,042 Alb 3.2,  Ast 26, Alt 18  Pt will be admitted for ARF, and Anemia and lower abdominal discomfort    Review of systems:    In addition to the HPI above,  No Fever-chills, No Headache, No changes with Vision or hearing, No problems swallowing food or Liquids, No Chest pain, Cough or Shortness of Breath, No Nausea or Vommitting, Bowel movements are regular, No Blood in stool or Urine, No dysuria, No new skin rashes or bruises, No new joints pains-aches,  No new weakness,  tingling, numbness in any extremity, No recent weight gain or loss, No polyuria, polydypsia or polyphagia, No significant Mental Stressors.  A full 10 point Review of Systems was done, except as stated above, all other Review of Systems were negative.   With Past History of the following :    Past Medical History:  Diagnosis Date  . Appendicitis   . Cancer ([place])    SUN CANCER---Right Eye      Past Surgical History:  Procedure Laterality Date  . VASECTOMY        Social History:     Social History   Tobacco Use  . Smoking status: Never Smoker  . Smokeless tobacco: Never Used  Substance Use Topics  . Alcohol use: No     Lives - at home  Mobility - walks by self   Family History :     Family  History  Problem Relation Age of Onset  . Asthma Father       Home Medications:   Prior to Admission medications   Medication Sig Start Date End Date Taking? Authorizing Provider  ASPIRIN ADULT PO Take 0.5 tablets by mouth once as needed (for pain-headache).   Yes [provider]     Allergies:     Allergies  Allergen Reactions  . Talc Itching and Rash     Physical Exam:   Vitals  Blood pressure (!) 142/72, pulse 83, temperature 98.4 F (36.9 C), temperature source Oral, resp. rate 20, weight 79.4 kg (175 lb), SpO2 96 %.   1. General lying in bed in NAD,    2. Normal affect and insight, Not Suicidal or Homicidal, Awake Alert, Oriented X 3.  3. No F.N deficits, ALL C.Nerves Intact, Strength 5/5 all 4 extremities, Sensation intact all 4 extremities, Plantars down going.  4. Ears and Eyes appear Normal, Conjunctivae clear, PERRLA. Moist Oral Mucosa.  5. Supple Neck, No JVD, No cervical lymphadenopathy appriciated, No Carotid Bruits.  6. Symmetrical Chest wall movement, Good air movement bilaterally, CTAB.  7. RRR, No Gallops, Rubs or Murmurs, No Parasternal Heave.  8. Positive Bowel Sounds, Abdomen Soft, No tenderness, No organomegaly  appriciated,No rebound -guarding or rigidity.  9.  No Cyanosis, Normal Skin Turgor, No Skin Rash or Bruise.  10. Good muscle tone,  joints appear normal , no effusions, Normal ROM.  11. No Palpable Lymph Nodes in Neck or Axillae     Data Review:    CBC Recent Labs  Lab 11/23/17 1528  WBC 9.2  HGB 9.2*  HCT 29.4*  PLT 203  MCV 92.2  MCH 28.8  MCHC 31.3  RDW 14.0  LYMPHSABS 1.5  MONOABS 1.0  EOSABS 0.1  BASOSABS 0.0   ------------------------------------------------------------------------------------------------------------------  Chemistries  Recent Labs  Lab 11/23/17 1528 11/23/17 1601  NA 139  --   K 3.2*  --   CL 99*  --   CO2 29  --   GLUCOSE 105*  --   BUN 37*  --   CREATININE 4.59*  --   CALCIUM 8.7*  --   AST  --  26  ALT  --  18  ALKPHOS  --  50  BILITOT  --  0.8   ------------------------------------------------------------------------------------------------------------------ estimated creatinine clearance is 14.2 mL/min (A) (by C-G formula based on SCr of 4.59 mg/dL (H)). ------------------------------------------------------------------------------------------------------------------ No results for input(s): TSH, T4TOTAL, T3FREE, THYROIDAB in the last 72 hours.  Invalid input(s): FREET3  Coagulation profile No results for input(s): INR, PROTIME in the last 168 hours. ------------------------------------------------------------------------------------------------------------------- No results for input(s): DDIMER in the last 72 hours. -------------------------------------------------------------------------------------------------------------------  Cardiac Enzymes Recent Labs  Lab 11/23/17 1528  TROPONINI 0.17*   ------------------------------------------------------------------------------------------------------------------    Component Value Date/Time   BNP 1,042.0 (H) 11/23/2017 1601      ---------------------------------------------------------------------------------------------------------------  Urinalysis No results found for: COLORURINE, APPEARANCEUR, LABSPEC, PHURINE, GLUCOSEU, HGBUR, BILIRUBINUR, KETONESUR, PROTEINUR, UROBILINOGEN, NITRITE, LEUKOCYTESUR  ----------------------------------------------------------------------------------------------------------------   Imaging Results:    Dg Abd Acute W/chest  Result Date: 11/23/2017 CLINICAL DATA:  Generalize weakness, shortness of breath, and nausea for the past 3 weeks. Patient reports a near syncopal episode this morning. The patient has orthostasis. 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 EXAM: DG ABDOMEN ACUTE W/ 1V CHEST COMPARISON:  None in PACs FINDINGS: The lungs are adequately inflated. There small bilateral pleural effusions. There is no pneumothorax. The interstitial markings are coarse  and become confluent at the lung bases. The heart is normal in size. The pulmonary vascularity is not engorged. There is calcification in the wall of the aortic arch. Within the abdomen there are loops of mildly distended gas-filled small bowel in the upper abdomen. On is small amount of colonic gas is present. There is some gas and stool in the rectum. No free extraluminal gas collections are observed. There are no abnormal soft tissue calcifications. IMPRESSION: The bowel gas pattern suggests a gastroenteritis or ileus type pattern. There is no evidence of perforation or high-grade obstruction. Interstitial changes throughout both lungs which may be acute or chronic. There is bibasilar atelectasis or pneumonia. There are small bilateral pleural effusions of uncertain age. The heart and pulmonary vascularity are within the limits of normal. Thoracic aortic atherosclerosis. Electronically Signed   By: David  Martinique M.D.   On: 11/23/2017 16:53       Assessment & Plan:    Principal  Problem:   ARF (acute renal failure) (HCC) Active Problems:   Elevated troponin   Anemia   Hypokalemia    ARF Check urine sodium, urine creatinine urine eosinophils Check CT scan abd/ pelvis r/o hydronephrosis Hydrate with ns iv Check cmp in am Nephrology consult entered in computer   Hypokalemia Check cmp in am  Anemia Check ferritin, iron, tibc, folate, b12, esr, spep Check cbc in am  Elevated troponin Check trop I q6h x3 Check cardiac echo Cardiology consult   DVT Prophylaxis Heparin - SCDs  AM Labs Ordered, also please review Full Orders  Family Communication: Admission, patients condition and plan of care including tests being ordered have been discussed with the patient  who indicate understanding and agree with the plan and Code Status.  Code Status FULL CODE  Likely DC to  home  Condition GUARDED    Consults called: cardiology consult, nephrology consult entered in computer  Admission status: inpatient  Time spent in minutes : 45   Jani Gravel M.D on 11/23/2017 at 7:04 PM  Between 7am to 7pm - Pager - [phone]. After 7pm go to [email or web address] - password [facility]  [facility] - Office  [phone]

## 2017-11-23 NOTE — ED Notes (Signed)
Date and time results received: 11/23/17 5:17 PM  (use smartphrase ".now" to insert current time)  Test: Troponin Critical Value: 0.17  Name of Provider Notified: Jeneen Rinks  Orders Received? Or Actions Taken?: Orders Received - See Orders for details

## 2017-11-23 NOTE — ED Notes (Signed)
ED Provider at bedside. 

## 2017-11-23 NOTE — ED Triage Notes (Signed)
Pt c/o gen weakness, nausea and sob x 3 weeks. States he might have had syncopal episode this am. Dizziness with standing. A/o. 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 occ non prod cough. Can not lay flat

## 2017-11-23 NOTE — Telephone Encounter (Signed)
They did request hospice.

## 2017-11-23 NOTE — ED Provider Notes (Signed)
[facility] EMERGENCY DEPARTMENT Provider Note   CSN: [id number] Arrival date & time: 11/23/17  1457     History   Chief Complaint Chief Complaint  Patient presents with  . Nausea  . Weakness  . Shortness of Breath    HPI Tony [name] is a 84 y.o. male.  Chief complaint is difficulty breathing.  HPI: Tony [name] is 10.  He is not had much for medical care in the last decade.  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  He underwent evaluation including CT scans.  During the course of this evaluation was found to have a thyroid nodule.  Biopsy shows thyroid cancer.  He is elected to not go treatment for either condition.  He wears a bandage around his eye and has chronic oozing and bleeding from this fungating mass.  He has lost the vision in that eye.  He is starting to have poor vision in his right eye.  Is recently become more involved with his daughter who is helping him to undergo some attempt at treatment.  They have recently engaged hospice as well.  He complains of progressive shortness of breath and weakness.  No headache.  No difficulty swallowing.  Occasionally feels like "something sharp is poking my chest".  He feels short of breath and cannot lay flat.  However he is nonspecific about this stating that is mostly comfort that keeps him on pillows rather than dyspnea.  He has noticed increasing swelling of his lower extremities.  He still making urine.  He has a poor appetite but states that he eats "fairly well".     Past Medical History:  Diagnosis Date  . Appendicitis   . Cancer ([place])    SUN CANCER---Right Eye    Patient Active Problem List   Diagnosis Date Noted  . ARF (acute renal failure) ([place]) 11/23/2017  . Elevated troponin 11/23/2017  . Anemia 11/23/2017  . Hypokalemia 11/23/2017  . Dizziness 11/23/2017  . Basal cell carcinoma 06/25/2017    Past Surgical History:  Procedure Laterality Date  . VASECTOMY           Home Medications    Prior to Admission medications   Medication Sig Start Date End Date Taking? Authorizing Provider  ASPIRIN ADULT PO Take 0.5 tablets by mouth once as needed (for pain-headache).   Yes [provider]    Family History Family History  Problem Relation Age of Onset  . Asthma Father     Social History Social History   Tobacco Use  . Smoking status: Never Smoker  . Smokeless tobacco: Never Used  Substance Use Topics  . Alcohol use: No  . Drug use: No     Allergies   Talc   Review of Systems Review of Systems  Constitutional: Positive for activity change, appetite change and fatigue. Negative for chills, diaphoresis and fever.  HENT: Negative for mouth sores, sore throat and trouble swallowing.   Eyes: Positive for pain, redness and visual disturbance.       Eye pain and chronic bleeding from known cancer.  Respiratory: Positive for cough and shortness of breath. Negative for chest tightness and wheezing.   Cardiovascular: Positive for chest pain and leg swelling.  Gastrointestinal: Positive for abdominal pain and nausea. Negative for abdominal distention, diarrhea and vomiting.  Endocrine: Negative for polydipsia, polyphagia and polyuria.  Genitourinary: Negative for decreased urine volume, dysuria, frequency and hematuria.  Musculoskeletal: Negative for gait problem.  Skin: Negative  for color change, pallor and rash.  Neurological: Positive for weakness. Negative for dizziness, syncope, light-headedness and headaches.  Hematological: Does not bruise/bleed easily.  Psychiatric/Behavioral: Negative for behavioral problems and confusion. The patient is nervous/anxious.      Physical Exam Updated Vital Signs BP 135/72   Pulse 78   Temp 98.4 F (36.9 C) (Oral)   Resp (!) 22   Wt 79.4 kg (175 lb)   SpO2 96%   BMI 23.09 kg/m   Physical Exam  Constitutional: He is oriented to person, place, and time. No distress.  Elderly  chronically ill-appearing 84 year old male with bloody dressing over his left eye.  Becomes easily anxious and cries.  HENT:  Head: Normocephalic.  Eyes: Conjunctivae are normal. No scleral icterus.  Left eye and orbit are essentially overcome with erythematous hypertrophic tissue.  He has no vision from the eye  Neck: Normal range of motion. Neck supple. No JVD present. No thyromegaly present.  Cardiovascular: Exam reveals no gallop and no friction rub.  No murmur heard. Irregular rhythm.  No JVD.  Pulmonary/Chest: Effort normal and breath sounds normal. No respiratory distress. He has no wheezes. He has no rales.  No crackles.  Diminished basilar breath sounds.  Abdominal: Soft. Bowel sounds are normal. He exhibits no distension. There is no tenderness. There is no rebound.  Musculoskeletal: Normal range of motion.  Neurological: He is alert and oriented to person, place, and time.  Skin: Skin is warm and dry. No rash noted.  2+ lower extremity edema  Psychiatric: He has a normal mood and affect. His behavior is normal.     ED Treatments / Results  Labs (all labs ordered are listed, but only abnormal results are displayed) Labs Reviewed  TROPONIN I - Abnormal; Notable for the following components:      Result Value   Troponin I 0.17 (*)    All other components within normal limits  CBC WITH DIFFERENTIAL/PLATELET - Abnormal; Notable for the following components:   RBC 3.19 (*)    Hemoglobin 9.2 (*)    HCT 29.4 (*)    All other components within normal limits  BASIC METABOLIC PANEL - Abnormal; Notable for the following components:   Potassium 3.2 (*)    Chloride 99 (*)    Glucose, Bld 105 (*)    BUN 37 (*)    Creatinine, Ser 4.59 (*)    Calcium 8.7 (*)    GFR calc non Af Amer 11 (*)    GFR calc Af Amer 13 (*)    All other components within normal limits  BRAIN NATRIURETIC PEPTIDE - Abnormal; Notable for the following components:   B Natriuretic Peptide 1,042.0 (*)    All  other components within normal limits  HEPATIC FUNCTION PANEL - Abnormal; Notable for the following components:   Albumin 3.2 (*)    All other components within normal limits  LIPASE, BLOOD  SODIUM, URINE, RANDOM  CALCIUM / CREATININE RATIO, URINE  TROPONIN I  TROPONIN I  TROPONIN I  FERRITIN  IRON AND TIBC  VITAMIN B12  FOLATE RBC  SEDIMENTATION RATE  PROTEIN ELECTROPHORESIS, SERUM  CYTOLOGY - NON PAP    EKG None  Radiology Ct Abdomen Pelvis Wo Contrast  Result Date: 11/23/2017 CLINICAL DATA:  Generalized weakness with nausea and shortness of breath over the last 3 weeks. EXAM: CT ABDOMEN AND PELVIS WITHOUT CONTRAST TECHNIQUE: Multidetector CT imaging of the abdomen and pelvis was performed following the standard protocol without IV contrast. COMPARISON:  Radiography same day. FINDINGS: Lower chest: Moderate to large bilateral effusions layering dependently with dependent pulmonary atelectasis and or infiltrate. Hepatobiliary: No liver parenchymal lesion is seen. The liver is somewhat small raising the possibility of cirrhosis or previous liver injury. There are a few calcified gallstones within the gallbladder. No sign of biliary obstruction or cholecystitis by CT. Pancreas: Normal Spleen: Normal Adrenals/Urinary Tract: Adrenal glands are normal. Renal parenchymal tissue is normal on each side. Patient has bilateral hydroureteronephrosis. The bladder is markedly distended in this is probably secondary to that. Prostate gland appears enlarged. Stomach/Bowel: No active bowel pathology is seen. Diverticulosis without evidence of diverticulitis. Vascular/Lymphatic: Aortic atherosclerosis. No aneurysm. IVC is normal. No retroperitoneal adenopathy or mass. Reproductive: Enlarged prostate as noted above.  Otherwise negative. Other: No free fluid or air. Musculoskeletal: Ordinary degenerative changes affect the lumbar spine. No evidence of advanced disease or apparent spinal stenosis. IMPRESSION:  Moderate to large bilateral effusions layering dependently with dependent atelectasis and or infiltrate. Distended urinary bladder. Bilateral hydroureteronephrosis, likely secondary to that. Enlarged prostate. Aortic atherosclerosis. Electronically Signed   By: Nelson Chimes M.D.   On: 11/23/2017 21:00   Dg Abd Acute W/chest  Result Date: 11/23/2017 CLINICAL DATA:  Generalize weakness, shortness of breath, and nausea for the past 3 weeks. Patient reports a near syncopal episode this morning. The patient has orthostasis. 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 EXAM: DG ABDOMEN ACUTE W/ 1V CHEST COMPARISON:  None in PACs FINDINGS: The lungs are adequately inflated. There small bilateral pleural effusions. There is no pneumothorax. The interstitial markings are coarse and become confluent at the lung bases. The heart is normal in size. The pulmonary vascularity is not engorged. There is calcification in the wall of the aortic arch. Within the abdomen there are loops of mildly distended gas-filled small bowel in the upper abdomen. On is small amount of colonic gas is present. There is some gas and stool in the rectum. No free extraluminal gas collections are observed. There are no abnormal soft tissue calcifications. IMPRESSION: The bowel gas pattern suggests a gastroenteritis or ileus type pattern. There is no evidence of perforation or high-grade obstruction. Interstitial changes throughout both lungs which may be acute or chronic. There is bibasilar atelectasis or pneumonia. There are small bilateral pleural effusions of uncertain age. The heart and pulmonary vascularity are within the limits of normal. Thoracic aortic atherosclerosis. Electronically Signed   By: David  Martinique M.D.   On: 11/23/2017 16:53    Procedures Procedures (including critical care time)  Medications Ordered in ED Medications  traMADol (ULTRAM) tablet 50 mg (has no administration in time range)       Initial Impression / Assessment and Plan / ED Course  I have reviewed the triage vital signs and the nursing notes.  Pertinent labs & imaging results that were available during my care of the patient were reviewed by me and considered in my medical decision making (see chart for details).  EKG: Left bundle branch block.  Sinus rhythm.  Rate 89.  Occasional PACs.  Complicated case.  Patient's BNP is elevated but clinically and radiographically not in left-sided congestive heart failure.  He has pleural effusions but no CHF.  Globin 9.2 but baseline for him compared to labs from [facility] in November.  Acute kidney injury BUN 37 creatinine 4.5 versus normal values from November.  Elevated troponin 0.17.  Difference diagnosis includes N STEMI versus retained troponin from renal insufficiency.  CT of  the abdomen was ordered has a patient has discomfort.  This does show bilateral pleural effusions distended urinary bladder and bilateral hydronephrosis and enlarged prostate.  This may be the etiology for his renal failure.  Long discussion with patient and his daughter about their wishes regarding hospice versus treatment for his acute renal insufficiency.  This may be relieved by catheter placement.  Discussed the case with Dr. Maudie Mercury.  Patient be admitted.  We will involve palliative care.   Final Clinical Impressions(s) / ED Diagnoses   Final diagnoses:  AKI (acute kidney injury) [facility])  Urinary retention    ED Discharge Orders    None       Tanna Furry, MD 11/23/17 2121

## 2017-11-24 ENCOUNTER — Inpatient Hospital Stay (HOSPITAL_COMMUNITY): Payer: Medicare Other

## 2017-11-24 ENCOUNTER — Telehealth: Payer: Self-pay | Admitting: Family Medicine

## 2017-11-24 DIAGNOSIS — D508 Other iron deficiency anemias: Secondary | ICD-10-CM

## 2017-11-24 DIAGNOSIS — J90 Pleural effusion, not elsewhere classified: Secondary | ICD-10-CM | POA: Diagnosis present

## 2017-11-24 DIAGNOSIS — I361 Nonrheumatic tricuspid (valve) insufficiency: Secondary | ICD-10-CM

## 2017-11-24 DIAGNOSIS — E876 Hypokalemia: Secondary | ICD-10-CM

## 2017-11-24 DIAGNOSIS — R310 Gross hematuria: Secondary | ICD-10-CM | POA: Diagnosis present

## 2017-11-24 DIAGNOSIS — N139 Obstructive and reflux uropathy, unspecified: Secondary | ICD-10-CM | POA: Diagnosis present

## 2017-11-24 DIAGNOSIS — I255 Ischemic cardiomyopathy: Secondary | ICD-10-CM | POA: Diagnosis present

## 2017-11-24 DIAGNOSIS — I340 Nonrheumatic mitral (valve) insufficiency: Secondary | ICD-10-CM

## 2017-11-24 LAB — COMPREHENSIVE METABOLIC PANEL
ALT: 15 U/L — AB (ref 17–63)
AST: 25 U/L (ref 15–41)
Albumin: 2.9 g/dL — ABNORMAL LOW (ref 3.5–5.0)
Alkaline Phosphatase: 47 U/L (ref 38–126)
Anion gap: 13 (ref 5–15)
BILIRUBIN TOTAL: 0.8 mg/dL (ref 0.3–1.2)
BUN: 39 mg/dL — AB (ref 6–20)
CHLORIDE: 100 mmol/L — AB (ref 101–111)
CO2: 27 mmol/L (ref 22–32)
CREATININE: 4.83 mg/dL — AB (ref 0.61–1.24)
Calcium: 8.3 mg/dL — ABNORMAL LOW (ref 8.9–10.3)
GFR calc Af Amer: 12 mL/min — ABNORMAL LOW (ref >60)
GFR, EST NON AFRICAN AMERICAN: 10 mL/min — AB (ref >60)
Glucose, Bld: 122 mg/dL — ABNORMAL HIGH (ref 65–99)
Potassium: 3 mmol/L — ABNORMAL LOW (ref 3.5–5.1)
Sodium: 140 mmol/L (ref 135–145)
TOTAL PROTEIN: 6.6 g/dL (ref 6.5–8.1)

## 2017-11-24 LAB — CBC
HCT: 27.2 % — ABNORMAL LOW (ref 39.0–52.0)
Hemoglobin: 8.7 g/dL — ABNORMAL LOW (ref 13.0–17.0)
MCH: 29.4 pg (ref 26.0–34.0)
MCHC: 32 g/dL (ref 30.0–36.0)
MCV: 91.9 fL (ref 78.0–100.0)
PLATELETS: 194 10*3/uL (ref 150–400)
RBC: 2.96 MIL/uL — ABNORMAL LOW (ref 4.22–5.81)
RDW: 14 % (ref 11.5–15.5)
WBC: 7.7 10*3/uL (ref 4.0–10.5)

## 2017-11-24 LAB — LACTATE DEHYDROGENASE: LDH: 173 U/L (ref 98–192)

## 2017-11-24 LAB — PROTEIN, PLEURAL OR PERITONEAL FLUID: Total protein, fluid: <3 g/dL

## 2017-11-24 LAB — IRON AND TIBC
Iron: 23 ug/dL — ABNORMAL LOW (ref 45–182)
Saturation Ratios: 9 % — ABNORMAL LOW (ref 17.9–39.5)
TIBC: 244 ug/dL — ABNORMAL LOW (ref 250–450)
UIBC: 221 ug/dL

## 2017-11-24 LAB — BODY FLUID CELL COUNT WITH DIFFERENTIAL
Eos, Fluid: 0 %
Lymphs, Fluid: 77 %
MONOCYTE-MACROPHAGE-SEROUS FLUID: 15 % — AB (ref 50–90)
Neutrophil Count, Fluid: 8 % (ref 0–25)
WBC FLUID: 553 uL (ref 0–1000)

## 2017-11-24 LAB — HEMOGLOBIN AND HEMATOCRIT, BLOOD
HCT: 28.4 % — ABNORMAL LOW (ref 39.0–52.0)
HCT: 28.9 % — ABNORMAL LOW (ref 39.0–52.0)
HCT: 29.2 % — ABNORMAL LOW (ref 39.0–52.0)
Hemoglobin: 9 g/dL — ABNORMAL LOW (ref 13.0–17.0)
Hemoglobin: 9.2 g/dL — ABNORMAL LOW (ref 13.0–17.0)
Hemoglobin: 9.3 g/dL — ABNORMAL LOW (ref 13.0–17.0)

## 2017-11-24 LAB — VITAMIN B12: VITAMIN B 12: 221 pg/mL (ref 180–914)

## 2017-11-24 LAB — ECHOCARDIOGRAM COMPLETE
HEIGHTINCHES: 73 in
WEIGHTICAEL: 3118.19 [oz_av]

## 2017-11-24 LAB — GRAM STAIN

## 2017-11-24 LAB — TROPONIN I
TROPONIN I: 0.14 ng/mL — AB (ref <0.03)
Troponin I: 0.12 ng/mL (ref <0.03)

## 2017-11-24 LAB — FERRITIN: Ferritin: 164 ng/mL (ref 24–336)

## 2017-11-24 LAB — SODIUM, URINE, RANDOM: Sodium, Ur: 28 mmol/L

## 2017-11-24 MED ORDER — SODIUM CHLORIDE 0.45 % IV SOLN: INTRAVENOUS | Status: DC

## 2017-11-24 MED ORDER — MORPHINE SULFATE (PF) 2 MG/ML IV SOLN: 1.0000 mg | INTRAVENOUS | Status: DC | PRN

## 2017-11-24 MED ORDER — SODIUM CHLORIDE 0.45 % IV SOLN
INTRAVENOUS | Status: DC
  Administered 2017-11-24 – 2017-11-25 (×5): via INTRAVENOUS
  Filled 2017-11-24 (×10): qty 1000

## 2017-11-24 NOTE — Telephone Encounter (Signed)
Please call daughter and advise that we are following hospital notes. Please let us know if he would like to follow up after hospital.  [name], MD

## 2017-11-24 NOTE — Progress Notes (Addendum)
Pt has dressing to left eye with visible bloody drainage. Daughter has asked that staff not removing dressing and states that she will take care of it, changing it as needed.

## 2017-11-24 NOTE — Progress Notes (Signed)
Notified mid level of patient's 0.15 troponin level.

## 2017-11-24 NOTE — Progress Notes (Signed)
Pt's wound to left eye oozing blood and urine appears to have blood in it. MD notified. Heparin discontinued. Will continue to closely monitor.

## 2017-11-24 NOTE — Progress Notes (Signed)
*  PRELIMINARY RESULTS* Echocardiogram 2D Echocardiogram has been performed.  Leavy Cella 11/24/2017, 10:37 AM

## 2017-11-24 NOTE — Progress Notes (Signed)
PROGRESS NOTE                                                                                                                                                                                                             Patient Demographics:    Tony [name], is a 84 y.o. male, DOB - July 14, 1934, OFB:[id number]  Admit date - 11/23/2017   Admitting Physician Jani Gravel, MD  Outpatient Primary MD for the patient is [name], MD  LOS - 1  Outpatient Specialists: none  Chief Complaint  Patient presents with  . Nausea  . Weakness  . Shortness of Breath       Brief Narrative   84 year old male with basal cell carcinoma of the left eyelid/face/orbit, for several years (declined treatment) and possible thyroid cancer (as per biopsy in November 2018) but declined further treatment or workup presented with nausea, poor appetite and dizziness for the past 3 weeks.  Also reported discomfort in his lower abdomen for the same duration.  He does report having increased dyspnea on exertion and unable to lie down flat for the last few days.  He also has noticed increased bleeding from the basal cell carcinoma over the left eye.  Also reports increased urinary urgency and hesitancy.  denies any fever, headache, dizziness, nausea, vomiting, chest pain, palpitations, dysuria or diarrhea.  He denies any new medications, recent illness.  He has been doing his own dressing changes for his basal cell carcinoma.  He had MRI of the brain done in November 2018 had [facility] showing enhancing mass extending to the retrobulbar soft tissue area and involving multiple extraocular muscles, left globe and extending into the left maxillary sinus.  He was offered surgical resection by ENT but seems to have declined. In the ED he was found to be in acute kidney injury with creatinine of 4.59, BUN of 37, BNP of 1000 and elevated troponin of 0.17.  Chest x-ray showed  moderate to large bilateral effusions.  CT of the abdomen and pelvis showed distended urinary bladder with bilateral hydroureteronephrosis and enlarged prostate.  Admitted to telemetry for further management.  Cardiology and nephrology consulted.  During the night he was found to have difficulty voiding with bladder scan showing 800 cc retained urine.  Foley was placed in with frank hematuria on the back with multiple clots.  Urology was consulted who recommended intermittent irrigation.    Subjective:   Patient reports abdominal discomfort to have improved after Foley placed in.  Still has some shortness of breath with orthopnea.   Assessment  & Plan :    Principal Problem: Acute kidney injury ([place]) Suspect due to obstructive uropathy +/- prerenal.  Nephrology consult appreciated.  Monitor renal function with Foley placed in.  Monitor with IV fluids.  Avoid nephrotoxins.  Active Problems:   Elevated troponin with acute systolic CHF Demand ischemia with acute cardiomyopathy.  Hypokinesis of anterior septal myocardium on 2D echo with EF of 25%.  Not a good candidate for intervention.  Cardiology consult appreciated and recommend palliative care consult. Underwent right thoracentesis with 1.3 L serosanguineous fluid removed.  Dyspnea improved.  Follow cell count, culture and cytology.  Gross hematuria ?  Underlying malignancy with bladder outlet obstruction.  Continue Foley with intermittent bladder irrigation.    Anemia Monitor for now.  Transfuse as needed    Hypokalemia Replenished  Basal cell carcinoma of the left eye Extensive lesion.  Patient refused surgical treatment.  ?  Thyroid malignancy As per biopsy in 06/2017.  Refused further treatment.     Code Status : DNR (my discussion with patient and his daughter he is not interested in escalation of care, aggressive treatment or tests and is inclined towards hospice.  Daughter has been talking with home hospice as  outpatient)  Family Communication  : Discussed with daughter at bedside  Disposition Plan  : Pending goals of care discussion.  Barriers For Discharge : Active symptoms  Consults  : Cardiology Nephrology Palliative care  Procedures  : 2D echo CT abdomen pelvis Ultrasound thoracentesis  DVT Prophylaxis  : SCDs  Lab Results  Component Value Date   PLT 194 11/24/2017    Antibiotics  :    Anti-infectives (From admission, onward)   None        Objective:   Vitals:   11/24/17 1421 11/24/17 1432 11/24/17 1441 11/24/17 1510  BP: 117/67 129/62 105/64 121/61  Pulse: 85 78 78 72  Resp:    18  Temp:    98.3 F (36.8 C)  TempSrc:    Oral  SpO2: 99% 98% 100% 98%  Weight:      Height:        Wt Readings from Last 3 Encounters:  11/23/17 88.4 kg (194 lb 14.2 oz)  06/25/17 84.7 kg (186 lb 12 oz)  06/10/17 81.6 kg (180 lb)     Intake/Output Summary (Last 24 hours) at 11/24/2017 1709 Last data filed at 11/24/2017 1700 Gross per 24 hour  Intake 1186.25 ml  Output 6800 ml  Net -5613.75 ml     Physical Exam  General: Elderly male appears fatigued, not in distress HEENT: Pallor present, patch over left eye (daughter did not allow me to examine or remove it), moist mucosa, supple neck Chest: Diminished bibasilar breath sounds, no crackles or wheezing CVS: N S1&S2, no murmurs,  GI: soft, NT, ND, BS+ Musculoskeletal: warm, trace edema, normal skin CNS: Alert and oriented, nonfocal    Data Review:    CBC Recent Labs  Lab 11/23/17 1528 11/24/17 0211 11/24/17 0637 11/24/17 1213  WBC 9.2 7.7  --   --   HGB 9.2* 8.7* 9.0* 9.2*  HCT 29.4* 27.2* 28.4* 28.9*  PLT 203 194  --   --   MCV 92.2 91.9  --   --   MCH 28.8 29.4  --   --  MCHC 31.3 32.0  --   --   RDW 14.0 14.0  --   --   LYMPHSABS 1.5  --   --   --   MONOABS 1.0  --   --   --   EOSABS 0.1  --   --   --   BASOSABS 0.0  --   --   --     Chemistries  Recent Labs  Lab 11/23/17 1528 11/23/17 1601  11/24/17 0211  NA 139  --  140  K 3.2*  --  3.0*  CL 99*  --  100*  CO2 29  --  27  GLUCOSE 105*  --  122*  BUN 37*  --  39*  CREATININE 4.59*  --  4.83*  CALCIUM 8.7*  --  8.3*  AST  --  26 25  ALT  --  18 15*  ALKPHOS  --  50 47  BILITOT  --  0.8 0.8   ------------------------------------------------------------------------------------------------------------------ No results for input(s): CHOL, HDL, LDLCALC, TRIG, CHOLHDL, LDLDIRECT in the last 72 hours.  No results found for: HGBA1C ------------------------------------------------------------------------------------------------------------------ No results for input(s): TSH, T4TOTAL, T3FREE, THYROIDAB in the last 72 hours.  Invalid input(s): FREET3 ------------------------------------------------------------------------------------------------------------------ Recent Labs    11/23/17 2100  VITAMINB12 221  FERRITIN 164  TIBC 244*  IRON 23*    Coagulation profile No results for input(s): INR, PROTIME in the last 168 hours.  No results for input(s): DDIMER in the last 72 hours.  Cardiac Enzymes Recent Labs  Lab 11/23/17 2109 11/24/17 0211 11/24/17 0637  TROPONINI 0.15* 0.14* 0.12*   ------------------------------------------------------------------------------------------------------------------    Component Value Date/Time   BNP 1,042.0 (H) 11/23/2017 1601    Inpatient Medications  Scheduled Meds: . chlorhexidine  15 mL Mouth Rinse BID  . mouth rinse  15 mL Mouth Rinse q12n4p   Continuous Infusions: . sodium chloride 0.45 % with kcl 125 mL/hr at 11/24/17 1121   PRN Meds:.acetaminophen **OR** acetaminophen, morphine injection, traMADol  Micro Results No results found for this or any previous visit (from the past 240 hour(s)).  Radiology Reports Ct Abdomen Pelvis Wo Contrast  Result Date: 11/23/2017 CLINICAL DATA:  Generalized weakness with nausea and shortness of breath over the last 3 weeks.  EXAM: CT ABDOMEN AND PELVIS WITHOUT CONTRAST TECHNIQUE: Multidetector CT imaging of the abdomen and pelvis was performed following the standard protocol without IV contrast. COMPARISON:  Radiography same day. FINDINGS: Lower chest: Moderate to large bilateral effusions layering dependently with dependent pulmonary atelectasis and or infiltrate. Hepatobiliary: No liver parenchymal lesion is seen. The liver is somewhat small raising the possibility of cirrhosis or previous liver injury. There are a few calcified gallstones within the gallbladder. No sign of biliary obstruction or cholecystitis by CT. Pancreas: Normal Spleen: Normal Adrenals/Urinary Tract: Adrenal glands are normal. Renal parenchymal tissue is normal on each side. Patient has bilateral hydroureteronephrosis. The bladder is markedly distended in this is probably secondary to that. Prostate gland appears enlarged. Stomach/Bowel: No active bowel pathology is seen. Diverticulosis without evidence of diverticulitis. Vascular/Lymphatic: Aortic atherosclerosis. No aneurysm. IVC is normal. No retroperitoneal adenopathy or mass. Reproductive: Enlarged prostate as noted above.  Otherwise negative. Other: No free fluid or air. Musculoskeletal: Ordinary degenerative changes affect the lumbar spine. No evidence of advanced disease or apparent spinal stenosis. IMPRESSION: Moderate to large bilateral effusions layering dependently with dependent atelectasis and or infiltrate. Distended urinary bladder. Bilateral hydroureteronephrosis, likely secondary to that. Enlarged prostate. Aortic atherosclerosis. Electronically Signed   By:  Nelson Chimes M.D.   On: 11/23/2017 21:00   Dg Chest 1 View  Result Date: 11/24/2017 CLINICAL DATA:  RIGHT pleural effusion post thoracentesis EXAM: CHEST  1 VIEW COMPARISON:  11/23/2017 FINDINGS: Enlargement of cardiac silhouette. Mediastinal contour stable with atherosclerotic calcification of aorta noted. Decreased RIGHT pleural  effusion and basilar atelectasis post thoracentesis. Persistent LEFT pleural effusion and basilar atelectasis. Question minimal perihilar edema. No pneumothorax following thoracentesis. Bones demineralized. IMPRESSION: No pneumothorax following RIGHT thoracentesis. Decrease in RIGHT pleural effusion and basilar atelectasis since 11/23/2017 with persistent LEFT pleural effusion and basilar atelectasis. Improved pulmonary edema. Electronically Signed   By: Lavonia Dana M.D.   On: 11/24/2017 15:11   Dg Abd Acute W/chest  Result Date: 11/23/2017 CLINICAL DATA:  Generalize weakness, shortness of breath, and nausea for the past 3 weeks. Patient reports a near syncopal episode this morning. The patient has orthostasis. 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 EXAM: DG ABDOMEN ACUTE W/ 1V CHEST COMPARISON:  None in PACs FINDINGS: The lungs are adequately inflated. There small bilateral pleural effusions. There is no pneumothorax. The interstitial markings are coarse and become confluent at the lung bases. The heart is normal in size. The pulmonary vascularity is not engorged. There is calcification in the wall of the aortic arch. Within the abdomen there are loops of mildly distended gas-filled small bowel in the upper abdomen. On is small amount of colonic gas is present. There is some gas and stool in the rectum. No free extraluminal gas collections are observed. There are no abnormal soft tissue calcifications. IMPRESSION: The bowel gas pattern suggests a gastroenteritis or ileus type pattern. There is no evidence of perforation or high-grade obstruction. Interstitial changes throughout both lungs which may be acute or chronic. There is bibasilar atelectasis or pneumonia. There are small bilateral pleural effusions of uncertain age. The heart and pulmonary vascularity are within the limits of normal. Thoracic aortic atherosclerosis. Electronically Signed   By: David  Martinique M.D.    On: 11/23/2017 16:53   US Thoracentesis Asp Pleural Space W/img Guide  Result Date: 11/24/2017 INDICATION: BILATERAL pleural effusions, for diagnostic and therapeutic RIGHT thoracentesis EXAM: ULTRASOUND GUIDED DIAGNOSTIC AND THERAPEUTIC RIGHT THORACENTESIS MEDICATIONS: None. COMPLICATIONS: None immediate. PROCEDURE: Procedure, benefits, and risks of procedure were discussed with patient. Written informed consent for procedure was obtained. Time out protocol followed. Pleural effusion localized by ultrasound at the posterior RIGHT hemithorax. Skin prepped and draped in usual sterile fashion. Skin and soft tissues anesthetized with 10 mL of 1% lidocaine. 8 French thoracentesis catheter placed into the RIGHT pleural space. 1.3 L of serosanguineous to amber colored RIGHT pleural fluid aspirated by syringe pump. Procedure tolerated well by patient without immediate complication. FINDINGS: A total of approximately 1.3 L of RIGHT pleural fluid was removed. Samples were sent to the laboratory as requested by the clinical team. IMPRESSION: Successful ultrasound guided RIGHT thoracentesis yielding 1.3 L of pleural fluid. Electronically Signed   By: Lavonia Dana M.D.   On: 11/24/2017 15:14    Time Spent in minutes  35   Citlaly Camplin M.D on 11/24/2017 at 5:09 PM  Between 7am to 7pm - Pager - [phone]  After 7pm go to [email or web address] - password [email or web address]  [facility] -  Office  [phone]

## 2017-11-24 NOTE — Consult Note (Addendum)
Cardiology Consult    Patient ID: Tony [name]; [id number]; March 27, 1934   Admit date: 11/23/2017 Date of Consult: 11/24/2017  Primary Care Provider: [name], MD Consulting Cardiologist: New to [facility] - Dr. Domenic Polite  Patient Profile    Tony [name] is a 84 y.o. male with past medical history of skin cancer, thyroid mass (concerning for malignancy - has declined treatment), and no prior cardiac history who is being seen today for the evaluation of elevated troponin at the request of Dr. Maudie Mercury.   History of Present Illness    Tony [name] presented to [facility] ED on 11/23/2017 for evaluation of worsening weakness and dyspnea over the past 3 weeks. In obtaining history from the patient and his daughter, she reports that he has been consuming little to no food on a daily basis and has been having worsening shortness of breath with minimal exertion. He also reports episodes of orthopnea and lower extremity edema. Denies any recent chest pain or palpitations.  His daughter has been trying to get him approved for hospice services but this is not yet in place. He has elected conservative management of his basal cell carcinoma and presumed thyroid cancer. He reports just wanting to "be comfortable".   Initial labs showed WBC 9.2, Hgb 9.2, platelets 203, Na+ 139, K+ 3.2, and creatinine 4.59 (at 1.06 five months ago). BNP elevated to 1042. Initial troponin elevated to 0.17 with subsequent values of 0.15 and 0.14. Abdominal film showed bowel gas pattern suggestive of gastroenteritis or ileus type pattern with no evidence of perforation or high-grade obstruction and interstitial changes along the lungs bilaterally with small bilateral pleural effusions. CT abdomen showing moderate to large bilateral effusions layering dependently with dependent atelectasis and/or infiltrate and bilateral hydroureteronephrosis.  He was started on heparin upon admission but earlier this morning was found to have frank hematuria in  his Foley catheter. Heparin was discontinued at that time.   Past Medical History:  Diagnosis Date  . Appendicitis   . Cancer ([place])    SUN CANCER---Right Eye    Past Surgical History:  Procedure Laterality Date  . VASECTOMY       Home Medications:  Prior to Admission medications   Medication Sig Start Date End Date Taking? Authorizing Provider  ASPIRIN ADULT PO Take 0.5 tablets by mouth once as needed (for pain-headache).   Yes [provider]    Inpatient Medications: Scheduled Meds: . chlorhexidine  15 mL Mouth Rinse BID  . mouth rinse  15 mL Mouth Rinse q12n4p   Continuous Infusions: . sodium chloride 100 mL/hr at 11/23/17 2249   PRN Meds: acetaminophen **OR** acetaminophen, morphine injection, traMADol  Allergies:    Allergies  Allergen Reactions  . Talc Itching and Rash    Social History:   Social History   Socioeconomic History  . Marital status: Married    Spouse name: Not on file  . Number of children: Not on file  . Years of education: Not on file  . Highest education level: Not on file  Occupational History  . Not on file  Social Needs  . Financial resource strain: Not on file  . Food insecurity:    Worry: Not on file    Inability: Not on file  . Transportation needs:    Medical: Not on file    Non-medical: Not on file  Tobacco Use  . Smoking status: Never Smoker  . Smokeless tobacco: Never Used  Substance and Sexual Activity  . Alcohol use:  No  . Drug use: No  . Sexual activity: Never  Lifestyle  . Physical activity:    Days per week: Not on file    Minutes per session: Not on file  . Stress: Not on file  Relationships  . Social connections:    Talks on phone: Not on file    Gets together: Not on file    Attends religious service: Not on file    Active member of club or organization: Not on file    Attends meetings of clubs or organizations: Not on file    Relationship status: Not on file  . Intimate partner violence:     Fear of current or ex partner: Not on file    Emotionally abused: Not on file    Physically abused: Not on file    Forced sexual activity: Not on file  Other Topics Concern  . Not on file  Social History Narrative   Live in [place], [place]. Lives alone. Cooks at home and eats out at times. Had three children, one is decreased.      Family History:    Family History  Problem Relation Age of Onset  . Asthma Father       Review of Systems    General:  No chills, fever, night sweats or weight changes.  Cardiovascular:  No chest pain, palpitations, paroxysmal nocturnal dyspnea. Positive for dyspnea on exertion, orthopnea, and edema.  Dermatological: No rash, lesions/masses Respiratory: No cough, dyspnea Urologic: No hematuria, dysuria Abdominal:   No nausea, vomiting, diarrhea, bright red blood per rectum, melena, or hematemesis Neurologic:  No visual changes, wkns, changes in mental status. All other systems reviewed and are otherwise negative except as noted above.  Physical Exam/Data    Vitals:   11/23/17 1930 11/23/17 2000 11/23/17 2205 11/24/17 0558  BP: 129/82 135/72 139/79 132/67  Pulse: 84 78 80 83  Resp: (!) 25 (!) 22 20 20   Temp:   98.2 F (36.8 C) 99.1 F (37.3 C)  TempSrc:   Oral Oral  SpO2: 97% 96% 97% 96%  Weight:   194 lb 14.2 oz (88.4 kg)   Height:   6' 1"  (1.854 m)     Intake/Output Summary (Last 24 hours) at 11/24/2017 0736 Last data filed at 11/24/2017 0559 Gross per 24 hour  Intake -  Output 2900 ml  Net -2900 ml   Filed Weights   11/23/17 1515 11/23/17 2205  Weight: 175 lb (79.4 kg) 194 lb 14.2 oz (88.4 kg)   Body mass index is 25.71 kg/m.   General: Pleasant elderly Caucasian male appearing in NAD Psych: Normal affect. Neuro: Alert and oriented X 3. Moves all extremities spontaneously. HEENT: Dressing in place along left orbit.   Neck: Supple without bruits. JVD at 9cm. Lungs:  Resp regular and unlabored, rales along bases  bilaterally. Heart: RRR no s3, s4, or murmurs. Abdomen: Soft, non-tender, non-distended, BS + x 4.  Extremities: No clubbing or cyanosis. 1+ pitting edema bilaterally. DP/PT/Radials 2+ and equal bilaterally.   EKG:  Not yet scanned into the chart and not available in paper chart.   Telemetry:  Telemetry was personally reviewed and demonstrates: NSR, HR in 80's to 90's.    Labs/Studies     Relevant CV Studies:  Echocardiogram: Pending  Laboratory Data:  Chemistry Recent Labs  Lab 11/23/17 1528 11/24/17 0211  NA 139 140  K 3.2* 3.0*  CL 99* 100*  CO2 29 27  GLUCOSE 105* 122*  BUN 37*  39*  CREATININE 4.59* 4.83*  CALCIUM 8.7* 8.3*  GFRNONAA 11* 10*  GFRAA 13* 12*  ANIONGAP 11 13    Recent Labs  Lab 11/23/17 1601 11/24/17 0211  PROT 7.0 6.6  ALBUMIN 3.2* 2.9*  AST 26 25  ALT 18 15*  ALKPHOS 50 47  BILITOT 0.8 0.8   Hematology Recent Labs  Lab 11/23/17 1528 11/24/17 0211 11/24/17 0637  WBC 9.2 7.7  --   RBC 3.19* 2.96*  --   HGB 9.2* 8.7* 9.0*  HCT 29.4* 27.2* 28.4*  MCV 92.2 91.9  --   MCH 28.8 29.4  --   MCHC 31.3 32.0  --   RDW 14.0 14.0  --   PLT 203 194  --    Cardiac Enzymes Recent Labs  Lab 11/23/17 1528 11/23/17 2109 11/24/17 0211  TROPONINI 0.17* 0.15* 0.14*   No results for input(s): TROPIPOC in the last 168 hours.  BNP Recent Labs  Lab 11/23/17 1601  BNP 1,042.0*    DDimer No results for input(s): DDIMER in the last 168 hours.  Radiology/Studies:  Ct Abdomen Pelvis Wo Contrast  Result Date: 11/23/2017 CLINICAL DATA:  Generalized weakness with nausea and shortness of breath over the last 3 weeks. EXAM: CT ABDOMEN AND PELVIS WITHOUT CONTRAST TECHNIQUE: Multidetector CT imaging of the abdomen and pelvis was performed following the standard protocol without IV contrast. COMPARISON:  Radiography same day. FINDINGS: Lower chest: Moderate to large bilateral effusions layering dependently with dependent pulmonary atelectasis and or  infiltrate. Hepatobiliary: No liver parenchymal lesion is seen. The liver is somewhat small raising the possibility of cirrhosis or previous liver injury. There are a few calcified gallstones within the gallbladder. No sign of biliary obstruction or cholecystitis by CT. Pancreas: Normal Spleen: Normal Adrenals/Urinary Tract: Adrenal glands are normal. Renal parenchymal tissue is normal on each side. Patient has bilateral hydroureteronephrosis. The bladder is markedly distended in this is probably secondary to that. Prostate gland appears enlarged. Stomach/Bowel: No active bowel pathology is seen. Diverticulosis without evidence of diverticulitis. Vascular/Lymphatic: Aortic atherosclerosis. No aneurysm. IVC is normal. No retroperitoneal adenopathy or mass. Reproductive: Enlarged prostate as noted above.  Otherwise negative. Other: No free fluid or air. Musculoskeletal: Ordinary degenerative changes affect the lumbar spine. No evidence of advanced disease or apparent spinal stenosis. IMPRESSION: Moderate to large bilateral effusions layering dependently with dependent atelectasis and or infiltrate. Distended urinary bladder. Bilateral hydroureteronephrosis, likely secondary to that. Enlarged prostate. Aortic atherosclerosis. Electronically Signed   By: Nelson Chimes M.D.   On: 11/23/2017 21:00   Dg Abd Acute W/chest  Result Date: 11/23/2017 CLINICAL DATA:  Generalize weakness, shortness of breath, and nausea for the past 3 weeks. Patient reports a near syncopal episode this morning. The patient has orthostasis. 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 EXAM: DG ABDOMEN ACUTE W/ 1V CHEST COMPARISON:  None in PACs FINDINGS: The lungs are adequately inflated. There small bilateral pleural effusions. There is no pneumothorax. The interstitial markings are coarse and become confluent at the lung bases. The heart is normal in size. The pulmonary vascularity is not engorged. There is  calcification in the wall of the aortic arch. Within the abdomen there are loops of mildly distended gas-filled small bowel in the upper abdomen. On is small amount of colonic gas is present. There is some gas and stool in the rectum. No free extraluminal gas collections are observed. There are no abnormal soft tissue calcifications. IMPRESSION: The bowel gas pattern suggests a  gastroenteritis or ileus type pattern. There is no evidence of perforation or high-grade obstruction. Interstitial changes throughout both lungs which may be acute or chronic. There is bibasilar atelectasis or pneumonia. There are small bilateral pleural effusions of uncertain age. The heart and pulmonary vascularity are within the limits of normal. Thoracic aortic atherosclerosis. Electronically Signed   By: David  Martinique M.D.   On: 11/23/2017 16:53     Assessment & Plan   1. Elevated Troponin  - Patient presented with worsening weakness and dyspnea on exertion over the past 3 weeks.  Cyclic troponin values were found to be flat at 0.17, 0.15 and 0.14.  An EKG is not available yet in the system for review. An echocardiogram is pending to assess for structural abnormalities. - Overall, his flat enzyme trend is likely secondary to demand ischemia in the setting of his acute kidney injury. Would not pursue further ischemic evaluation due to his co morbidities and AKI. No Heparin or ASA given frank hematuria. The patient and his family have elected conservative management of thyroid mass and progressive skin cancer. Would recommend Palliative Care consult for goals of care discussion in the setting of his multiple medical issues as they have been trying to get him approved for Hospice Care in the outpatient setting. Would also recommend addressing Code Status.   2. Acute CHF Exacerbation - BNP elevated to 1042 on admission with CT Imaging showing bilateral effusions. Echocardiogram pending to further determine systolic versus  diastolic dysfunction. He is currently receiving low-dose IVF in the setting of his acute kidney injury.  3. Acute Kidney Injury - Occurring in the setting of hydroureteronephrosis. Creatinine elevated to 4.59 on admission (at 1.06 five months ago). - agree with discontinuing Heparin in the setting of frank hematuria. Nephrology consult is pending.    For questions or updates, please contact [place] Please consult [email or web address] for contact info under Cardiology/STEMI.  Signed, Erma Heritage, PA-C 11/24/2017, 7:36 AM Pager: [phone]   Attending note:  Patient seen and examined.  Reviewed available records and discussed the case with Ms. Delano Metz.  Patient currently admitted to the hospital with progressive weakness and shortness of breath in the setting of poor oral intake, leg swelling, and a feeling of abdominal bloating.  He also reports that his urine output has dropped off.  History includes basal cell carcinoma and presumed thyroid cancer that the patient has not pursued in terms of treatment or aggressive workup.  He appears chronically ill on examination, pale.  He has decreased breath sounds at the bases with scattered rhonchi.  Cardiac exam reveals RRR with soft systolic murmur.  Abdomen is somewhat protuberant.  No guarding.  He has mild leg edema.  Lab work reveals creatinine 4.83, potassium 3.0, normal AST and ALT, mild flat elevation in troponin I of 0.17, 0.15, 0.14, and 0.12.  BNP is also elevated at 1042.  Hemoglobin is 8.7.  ESR 65.  CT imaging demonstrates moderate to large bilateral pleural effusions, distended urinary bladder with bilateral hydroureteronephrosis.  Also incidentally noted aortic atherosclerosis.  I personally reviewed his ECG from 11/23/2017 which shows sinus rhythm with prolonged PR interval and left bundle branch block.  Mild, flat elevation in troponin I most consistent with demand ischemia rather than true ACS, particularly in light  of active comorbidities.  Patient is presumed to have thyroid cancer but details are not clear.  He has had clinical decline with evidence of acute renal failure, bilateral pleural effusions, and also bladder  distention with hydronephrosis.  Foley catheter is in place with evidence of active hematuria.  Echocardiogram is been ordered by the primary team, we will follow-up on this.  He could have an associated cardiomyopathy, but I would not pursue an aggressive cardiac workup at this point.  Perhaps diagnostic/therapeutic thoracentesis would be of benefit in better understanding prognosis and also improving his shortness of breath.  May even want to consider Palliative Care consultation.  Satira Sark, M.D., F.A.C.C.

## 2017-11-24 NOTE — Progress Notes (Signed)
DNR order noted. Nursing attempted to place purple DNR bracelet on patient. Patient's daughter at bedside questioned DNR status. States "we discussed palliative care, but not DNR." patient states he would like to further discuss with MD before becoming DNR status. Text-paged Dr. Clementeen Graham to notify. Awaiting return call. Donavan Foil, RN

## 2017-11-24 NOTE — Progress Notes (Signed)
Patient and his daughter at bedside requested to have PT see patient today. Text-paged MD to notify. Patient's daughter later came to nursing station asking to speak with Quinn Axe. No palliative care consult ordered at this time. Notified Dr. Clementeen Graham. States he will see patient. Donavan Foil, RN

## 2017-11-24 NOTE — Consult Note (Signed)
Reason for Consult: Acute kidney injury Referring Physician: Dr. Binnie Kand Tony [name] is an 84 y.o. male.  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  Patient also complains of weakness, dry mouth and some difficulty in breathing especially when he lies down.  Complains of some fullness of his stomach.  Presently patient is states that he is feeling somewhat better and wants to walk around.  Patient denies any previous history of renal failure, BPH or hematuria.  Presently however he has some hematuria after Foley catheter insertion.  Past Medical History:  Diagnosis Date  . Appendicitis   . Cancer ([place])    SUN CANCER---Right Eye    Past Surgical History:  Procedure Laterality Date  . VASECTOMY      Family History  Problem Relation Age of Onset  . Asthma Father     Social History:  reports that he has never smoked. He has never used smokeless tobacco. He reports that he does not drink alcohol or use drugs.  Allergies:  Allergies  Allergen Reactions  . Talc Itching and Rash    Medications: I have reviewed the patient's current medications.  Results for orders placed or performed during the hospital encounter of 11/23/17 (from the past 48 hour(s))  Sodium, urine, random     Status: None   Collection Time: 11/23/17  3:45 AM  Result Value Ref Range   Sodium, Ur 28 mmol/L    Comment: Performed at [facility], [address], [place], [place] [address]  Troponin I     Status: Abnormal   Collection Time: 11/23/17  3:28 PM  Result Value Ref Range   Troponin I 0.17 (HH) <0.03 ng/mL    Comment: CRITICAL RESULT CALLED TO, READ BACK BY AND VERIFIED WITH: PATSTRAW,B AT 1716 ON 4.1.19 BY ISLEY,B Performed at [facility], [address], [place], [place] [address]   CBC with Differential     Status: Abnormal   Collection Time: 11/23/17   3:28 PM  Result Value Ref Range   WBC 9.2 4.0 - 10.5 K/uL   RBC 3.19 (L) 4.22 - 5.81 MIL/uL   Hemoglobin 9.2 (L) 13.0 - 17.0 g/dL   HCT 29.4 (L) 39.0 - 52.0 %   MCV 92.2 78.0 - 100.0 fL   MCH 28.8 26.0 - 34.0 pg   MCHC 31.3 30.0 - 36.0 g/dL   RDW 14.0 11.5 - 15.5 %   Platelets 203 150 - 400 K/uL   Neutrophils Relative % 72 %   Neutro Abs 6.6 1.7 - 7.7 K/uL   Lymphocytes Relative 16 %   Lymphs Abs 1.5 0.7 - 4.0 K/uL   Monocytes Relative 11 %   Monocytes Absolute 1.0 0.1 - 1.0 K/uL   Eosinophils Relative 1 %   Eosinophils Absolute 0.1 0.0 - 0.7 K/uL   Basophils Relative 0 %   Basophils Absolute 0.0 0.0 - 0.1 K/uL    Comment: Performed at [facility], [address], [place], [place] [address]  Basic metabolic panel     Status: Abnormal   Collection Time: 11/23/17  3:28 PM  Result Value Ref Range   Sodium 139 135 - 145 mmol/L   Potassium 3.2 (L) 3.5 - 5.1 mmol/L   Chloride 99 (L) 101 - 111 mmol/L   CO2 29 22 - 32 mmol/L   Glucose, Bld 105 (H)  65 - 99 mg/dL   BUN 37 (H) 6 - 20 mg/dL   Creatinine, Ser 4.59 (H) 0.61 - 1.24 mg/dL   Calcium 8.7 (L) 8.9 - 10.3 mg/dL   GFR calc non Af Amer 11 (L) >60 mL/min   GFR calc Af Amer 13 (L) >60 mL/min    Comment: (NOTE) The eGFR has been calculated using the CKD EPI equation. This calculation has not been validated in all clinical situations. eGFR's persistently <60 mL/min signify possible Chronic Kidney Disease.    Anion gap 11 5 - 15    Comment: Performed at [facility], [address] [place], [place] [address]  Brain natriuretic peptide     Status: Abnormal   Collection Time: 11/23/17  4:01 PM  Result Value Ref Range   B Natriuretic Peptide 1,042.0 (H) 0.0 - 100.0 pg/mL    Comment: Performed at [facility], [address] [place], [place] [address]  Lipase, blood     Status: None   Collection Time: 11/23/17  4:01 PM  Result Value Ref Range   Lipase 27 11 - 51 U/L    Comment: Performed at [facility], [address]  [place], [place] [address]  Hepatic function panel     Status: Abnormal   Collection Time: 11/23/17  4:01 PM  Result Value Ref Range   Total Protein 7.0 6.5 - 8.1 g/dL   Albumin 3.2 (L) 3.5 - 5.0 g/dL   AST 26 15 - 41 U/L   ALT 18 17 - 63 U/L   Alkaline Phosphatase 50 38 - 126 U/L   Total Bilirubin 0.8 0.3 - 1.2 mg/dL   Bilirubin, Direct 0.1 0.1 - 0.5 mg/dL   Indirect Bilirubin 0.7 0.3 - 0.9 mg/dL    Comment: Performed at [facility], [address], [place], [place] [address]  Troponin I (q 6hr x 3)     Status: Abnormal   Collection Time: 11/23/17  9:09 PM  Result Value Ref Range   Troponin I 0.15 (HH) <0.03 ng/mL    Comment: CRITICAL RESULT CALLED TO, READ BACK BY AND VERIFIED WITH: RICHARDSON,RN AT 2200 ON 4.1.2019 BY ISLEY,B Performed at [facility], [address], [place], [place] [address]   Sedimentation rate     Status: Abnormal   Collection Time: 11/23/17  9:09 PM  Result Value Ref Range   Sed Rate 65 (H) 0 - 16 mm/hr    Comment: Performed at [facility], [address] [place], [place] [address]  Troponin I (q 6hr x 3)     Status: Abnormal   Collection Time: 11/24/17  2:11 AM  Result Value Ref Range   Troponin I 0.14 (HH) <0.03 ng/mL    Comment: CRITICAL VALUE NOTED.  VALUE IS CONSISTENT WITH PREVIOUSLY REPORTED AND CALLED VALUE. Performed at [facility], [address] [place], [place] [address]   Comprehensive metabolic panel     Status: Abnormal   Collection Time: 11/24/17  2:11 AM  Result Value Ref Range   Sodium 140 135 - 145 mmol/L   Potassium 3.0 (L) 3.5 - 5.1 mmol/L   Chloride 100 (L) 101 - 111 mmol/L   CO2 27 22 - 32 mmol/L   Glucose, Bld 122 (H) 65 - 99 mg/dL   BUN 39 (H) 6 - 20 mg/dL   Creatinine, Ser 4.83 (H) 0.61 - 1.24 mg/dL   Calcium 8.3 (L) 8.9 - 10.3 mg/dL   Total Protein 6.6 6.5 - 8.1 g/dL   Albumin 2.9 (L)  3.5 - 5.0 g/dL   AST 25 15 - 41 U/L   ALT 15 (L) 17 - 63 U/L   Alkaline Phosphatase 47 38 - 126 U/L   Total Bilirubin 0.8 0.3 - 1.2 mg/dL    GFR calc non Af Amer 10 (L) >60 mL/min   GFR calc Af Amer 12 (L) >60 mL/min    Comment: (NOTE) The eGFR has been calculated using the CKD EPI equation. This calculation has not been validated in all clinical situations. eGFR's persistently <60 mL/min signify possible Chronic Kidney Disease.    Anion gap 13 5 - 15    Comment: Performed at [facility], [address] [place], [place] [address]  CBC     Status: Abnormal   Collection Time: 11/24/17  2:11 AM  Result Value Ref Range   WBC 7.7 4.0 - 10.5 K/uL   RBC 2.96 (L) 4.22 - 5.81 MIL/uL   Hemoglobin 8.7 (L) 13.0 - 17.0 g/dL   HCT 27.2 (L) 39.0 - 52.0 %   MCV 91.9 78.0 - 100.0 fL   MCH 29.4 26.0 - 34.0 pg   MCHC 32.0 30.0 - 36.0 g/dL   RDW 14.0 11.5 - 15.5 %   Platelets 194 150 - 400 K/uL    Comment: Performed at [facility], [address] [place], [place] [address]  Troponin I (q 6hr x 3)     Status: Abnormal   Collection Time: 11/24/17  6:37 AM  Result Value Ref Range   Troponin I 0.12 (HH) <0.03 ng/mL    Comment: CRITICAL VALUE NOTED.  VALUE IS CONSISTENT WITH PREVIOUSLY REPORTED AND CALLED VALUE. Performed at [facility], [address], [place], [place] [address]   Hemoglobin and hematocrit, blood     Status: Abnormal   Collection Time: 11/24/17  6:37 AM  Result Value Ref Range   Hemoglobin 9.0 (L) 13.0 - 17.0 g/dL   HCT 28.4 (L) 39.0 - 52.0 %    Comment: Performed at [facility], [address] [place], [place] [address]    Ct Abdomen Pelvis Wo Contrast  Result Date: 11/23/2017 CLINICAL DATA:  Generalized weakness with nausea and shortness of breath over the last 3 weeks. EXAM: CT ABDOMEN AND PELVIS WITHOUT CONTRAST TECHNIQUE: Multidetector CT imaging of the abdomen and pelvis was performed following the standard protocol without IV contrast. COMPARISON:  Radiography same day. FINDINGS: Lower chest: Moderate to large bilateral effusions layering dependently with dependent pulmonary atelectasis and or infiltrate.  Hepatobiliary: No liver parenchymal lesion is seen. The liver is somewhat small raising the possibility of cirrhosis or previous liver injury. There are a few calcified gallstones within the gallbladder. No sign of biliary obstruction or cholecystitis by CT. Pancreas: Normal Spleen: Normal Adrenals/Urinary Tract: Adrenal glands are normal. Renal parenchymal tissue is normal on each side. Patient has bilateral hydroureteronephrosis. The bladder is markedly distended in this is probably secondary to that. Prostate gland appears enlarged. Stomach/Bowel: No active bowel pathology is seen. Diverticulosis without evidence of diverticulitis. Vascular/Lymphatic: Aortic atherosclerosis. No aneurysm. IVC is normal. No retroperitoneal adenopathy or mass. Reproductive: Enlarged prostate as noted above.  Otherwise negative. Other: No free fluid or air. Musculoskeletal: Ordinary degenerative changes affect the lumbar spine. No evidence of advanced disease or apparent spinal stenosis. IMPRESSION: Moderate to large bilateral effusions layering dependently with dependent atelectasis and or infiltrate. Distended urinary bladder. Bilateral hydroureteronephrosis, likely secondary to that. Enlarged prostate. Aortic atherosclerosis. Electronically Signed   By: Nelson Chimes M.D.   On: 11/23/2017  21:00   Dg Abd Acute W/chest  Result Date: 11/23/2017 CLINICAL DATA:  Generalize weakness, shortness of breath, and nausea for the past 3 weeks. Patient reports a near syncopal episode this morning. The patient has orthostasis. 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 EXAM: DG ABDOMEN ACUTE W/ 1V CHEST COMPARISON:  None in PACs FINDINGS: The lungs are adequately inflated. There small bilateral pleural effusions. There is no pneumothorax. The interstitial markings are coarse and become confluent at the lung bases. The heart is normal in size. The pulmonary vascularity is not engorged. There is calcification  in the wall of the aortic arch. Within the abdomen there are loops of mildly distended gas-filled small bowel in the upper abdomen. On is small amount of colonic gas is present. There is some gas and stool in the rectum. No free extraluminal gas collections are observed. There are no abnormal soft tissue calcifications. IMPRESSION: The bowel gas pattern suggests a gastroenteritis or ileus type pattern. There is no evidence of perforation or high-grade obstruction. Interstitial changes throughout both lungs which may be acute or chronic. There is bibasilar atelectasis or pneumonia. There are small bilateral pleural effusions of uncertain age. The heart and pulmonary vascularity are within the limits of normal. Thoracic aortic atherosclerosis. Electronically Signed   By: David  Martinique M.D.   On: 11/23/2017 16:53    Review of Systems  Constitutional: Positive for malaise/fatigue. Negative for chills and fever.  Respiratory: Positive for shortness of breath.   Cardiovascular: Positive for orthopnea. Negative for chest pain.  Gastrointestinal: Positive for abdominal pain. Negative for nausea and vomiting.       Poor appetite  Neurological: Positive for dizziness and weakness.   Blood pressure 132/67, pulse 83, temperature 99.1 F (37.3 C), temperature source Oral, resp. rate 20, height 6' 1"  (1.854 m), weight 88.4 kg (194 lb 14.2 oz), SpO2 96 %. Physical Exam  Constitutional: He is oriented to person, place, and time. No distress.  Eyes: No scleral icterus.  Left eye is covered with dressing  Neck: No JVD present.  Cardiovascular: Normal rate and regular rhythm.  Respiratory: No respiratory distress. He has no wheezes.  GI: He exhibits no distension. There is no tenderness.  Musculoskeletal: He exhibits no edema.  Neurological: He is alert and oriented to person, place, and time.    Assessment/Plan: 1] acute kidney injury.  His creatinine was 1.06 on 06/10/2017.  Presently he came with high  creatinine.  CT scan of the abdomen showed bilateral hydrouretero necrosis.  Patient also was found to have an enlarged prostate.  Hence at this moment we are dealing with obstructive uropathy. 2] difficulty breathing and orthopnea.  Patient with some bilateral pleural effusion.  Possibly the difficulty breathing could be associated from distended bladder.  He has also some possible ileus.  He is feeling better. 3] history of Basal cell carcinoma of the face.  Presently patient is treated conservatively.  Patient also was possible thyroid cancer. 4] hypokalemia: His potassium is low.  Patient is not on diuretics. 5] bilateral pleural effusion 6] anemia Plan: 1] we will use half normal saline with 40 mEq of KCl at 125 cc/h 2] we will check his renal panel in the morning.  Tony [name] S 11/24/2017, 8:44 AM

## 2017-11-24 NOTE — Progress Notes (Signed)
Radiologist removed 1357mL from patient's right lung. Patients vital signs remained stable throughout the procedure. No complications.

## 2017-11-24 NOTE — Procedures (Signed)
PreOperative Dx: RT pleural effusion Postoperative Dx: RT pleural effusion Procedure:   US guided RT thoracentesis Radiologist:  Thornton Papas Anesthesia:  10 ml of 1% lidocaine Specimen:  1.3 L of serosanguinous to amber colored fluid EBL:   < 1 ml Complications: None

## 2017-11-24 NOTE — Progress Notes (Signed)
Patient reported feeling pressure to foley catheter site. Continues to drain bloody urine. Foley irrigated per orders, quarter sized clot noted on irrigation. MD at bedside and aware. Donavan Foil, RN

## 2017-11-24 NOTE — Progress Notes (Addendum)
Notified about patient having trouble voiding with bladder scan demonstrating 843 cc of retained urine with diminished ability to void.  Placed order for Foley catheter insertion.  0600 Patient began to have frank hematuria noted in Foley bag. Ordered stat H/H with repeats every 6 hours to monitor blood loss. Heparin DC earlier. CT abd/pelvis reviewed from ED. Will monitor repeat H/H for now and see if repeat study is needed. Irrigate Foley for now since blood clots are noted. Pt has abdominal pain for which I will order prn morphine.  0620 Patient assessed at bedside and clots appear to be clearing with irrigation, but hematuria is persistent. Spoke with Urology Dr. Pilar Jarvis who states that CBI is not currently indicated and that patient should continue to have prn irrigation for the clots.

## 2017-11-24 NOTE — Telephone Encounter (Signed)
Please see if we can get a Palliative Care consult for patient while in hospital. [name], MD

## 2017-11-24 NOTE — Progress Notes (Signed)
Patient transported to ultrasound for procedure and this RN will monitor the patient throughout the procedure. No problems occurred during transportation.

## 2017-11-24 NOTE — Progress Notes (Signed)
Patient ambulated in room to bathroom this am with nursing staff supervision/standby assist. Steady gait, but instructed patient not to attempt getting up on his own due to foley catheter, IV and weakness. Verbalized understanding. Daughter at bedside and verbalized understanding of safety plan as well. Bedalarm on for safety while in bed, call light and personal items kept within reach. Donavan Foil, RN

## 2017-11-24 NOTE — Progress Notes (Signed)
Notified Dr. Clementeen Graham of patient and daughter wishing for him to be full code and not DNR. Order received to change back to full code and they will follow-up with him tomorrow regarding goals of care. Donavan Foil, RN

## 2017-11-24 NOTE — Progress Notes (Signed)
Frank blood with clots in urine draining from foley catheter. Dr. Manuella Ghazi notified. Order given to irrigate foley. Foley irrigated twice and clots removed. Dr. Manuella Ghazi came to bedside to evaluate patient and will have Urology consulted. Will continue to closely monitor and irrigate foley as needed.

## 2017-11-24 NOTE — Telephone Encounter (Signed)
Called patient's daughter regarding message below. No answer, unable to leave message.

## 2017-11-25 ENCOUNTER — Other Ambulatory Visit: Payer: Self-pay

## 2017-11-25 DIAGNOSIS — N139 Obstructive and reflux uropathy, unspecified: Secondary | ICD-10-CM

## 2017-11-25 DIAGNOSIS — I255 Ischemic cardiomyopathy: Secondary | ICD-10-CM

## 2017-11-25 DIAGNOSIS — Z7189 Other specified counseling: Secondary | ICD-10-CM

## 2017-11-25 DIAGNOSIS — I429 Cardiomyopathy, unspecified: Secondary | ICD-10-CM

## 2017-11-25 DIAGNOSIS — Z515 Encounter for palliative care: Secondary | ICD-10-CM

## 2017-11-25 DIAGNOSIS — I248 Other forms of acute ischemic heart disease: Secondary | ICD-10-CM

## 2017-11-25 DIAGNOSIS — N179 Acute kidney failure, unspecified: Secondary | ICD-10-CM

## 2017-11-25 LAB — PROTEIN ELECTROPHORESIS, SERUM
A/G Ratio: 0.9 (ref 0.7–1.7)
ALBUMIN ELP: 3.1 g/dL (ref 2.9–4.4)
ALPHA-1-GLOBULIN: 0.3 g/dL (ref 0.0–0.4)
ALPHA-2-GLOBULIN: 1 g/dL (ref 0.4–1.0)
BETA GLOBULIN: 1 g/dL (ref 0.7–1.3)
GAMMA GLOBULIN: 1.4 g/dL (ref 0.4–1.8)
Globulin, Total: 3.6 g/dL (ref 2.2–3.9)
Total Protein ELP: 6.7 g/dL (ref 6.0–8.5)

## 2017-11-25 LAB — FOLATE RBC
Folate, Hemolysate: 492.2 ng/mL
Folate, RBC: 1790 ng/mL (ref >498)
Hematocrit: 27.5 % — ABNORMAL LOW (ref 37.5–51.0)

## 2017-11-25 LAB — RENAL FUNCTION PANEL
ALBUMIN: 2.7 g/dL — AB (ref 3.5–5.0)
ANION GAP: 12 (ref 5–15)
BUN: 31 mg/dL — ABNORMAL HIGH (ref 6–20)
CALCIUM: 8.1 mg/dL — AB (ref 8.9–10.3)
CO2: 27 mmol/L (ref 22–32)
Chloride: 102 mmol/L (ref 101–111)
Creatinine, Ser: 3.46 mg/dL — ABNORMAL HIGH (ref 0.61–1.24)
GFR calc non Af Amer: 15 mL/min — ABNORMAL LOW (ref >60)
GFR, EST AFRICAN AMERICAN: 18 mL/min — AB (ref >60)
Glucose, Bld: 97 mg/dL (ref 65–99)
PHOSPHORUS: 3.7 mg/dL (ref 2.5–4.6)
POTASSIUM: 3.2 mmol/L — AB (ref 3.5–5.1)
Sodium: 141 mmol/L (ref 135–145)

## 2017-11-25 LAB — BASIC METABOLIC PANEL
ANION GAP: 10 (ref 5–15)
BUN: 31 mg/dL — ABNORMAL HIGH (ref 6–20)
CALCIUM: 8.2 mg/dL — AB (ref 8.9–10.3)
CO2: 29 mmol/L (ref 22–32)
Chloride: 102 mmol/L (ref 101–111)
Creatinine, Ser: 3.49 mg/dL — ABNORMAL HIGH (ref 0.61–1.24)
GFR, EST AFRICAN AMERICAN: 17 mL/min — AB (ref >60)
GFR, EST NON AFRICAN AMERICAN: 15 mL/min — AB (ref >60)
Glucose, Bld: 98 mg/dL (ref 65–99)
Potassium: 3.5 mmol/L (ref 3.5–5.1)
SODIUM: 141 mmol/L (ref 135–145)

## 2017-11-25 LAB — HEMOGLOBIN AND HEMATOCRIT, BLOOD
HCT: 29.2 % — ABNORMAL LOW (ref 39.0–52.0)
Hemoglobin: 9.2 g/dL — ABNORMAL LOW (ref 13.0–17.0)

## 2017-11-25 LAB — CBC
HCT: 27.1 % — ABNORMAL LOW (ref 39.0–52.0)
HEMOGLOBIN: 8.4 g/dL — AB (ref 13.0–17.0)
MCH: 28.8 pg (ref 26.0–34.0)
MCHC: 31 g/dL (ref 30.0–36.0)
MCV: 92.8 fL (ref 78.0–100.0)
Platelets: 201 10*3/uL (ref 150–400)
RBC: 2.92 MIL/uL — AB (ref 4.22–5.81)
RDW: 14 % (ref 11.5–15.5)
WBC: 8.6 10*3/uL (ref 4.0–10.5)

## 2017-11-25 LAB — CALCIUM / CREATININE RATIO, URINE
CREATININE, UR: 96.7 mg/dL
Calcium, Ur: <0.8 mg/dL

## 2017-11-25 MED ORDER — OXYCODONE HCL 5 MG/5ML PO SOLN: 5.0000 mg | ORAL | Status: DC | PRN

## 2017-11-25 MED ORDER — METOPROLOL TARTRATE 25 MG PO TABS
12.5000 mg | ORAL_TABLET | Freq: Two times a day (BID) | ORAL | Status: DC
  Administered 2017-11-25 – 2017-11-26 (×4): 12.5 mg via ORAL
  Filled 2017-11-25 (×4): qty 1

## 2017-11-25 MED ORDER — LORAZEPAM 0.5 MG PO TABS: 0.5000 mg | ORAL_TABLET | Freq: Four times a day (QID) | ORAL | Status: DC | PRN

## 2017-11-25 NOTE — Progress Notes (Signed)
Notified by monitor tech that patient had converted from NSR to Afib. Pt shows no signs of distress or discomfort. Dr. Manuella Ghazi notified. Vital signs obtained and within normal limits. Metoprolol given per order. Will continue to closely monitor.

## 2017-11-25 NOTE — Progress Notes (Signed)
Called about patient regarding new finding of afib on telemetry with rates in the 90-100bpm range. Hemodynamically stable with bp 119/79 on repeat vitals and asymptomatic. Will start on metoprolol 12.5mg  bid for now. Reviewed Echo with LVEF 25%. Avoid anticoagulation due to ongoing hematuria. Continue monitoring on telemetry. Cardiology following.

## 2017-11-25 NOTE — Care Management (Signed)
Tony [name], Tony [name]    Male  19-May-1934 (83 yrs)  [id number]  MRN: [id number]    Demographics [address] [place], [place] [address] Home: [phone]       Work:        Mobile: [phone]       Email: [email or web address]     PCP: Philippa Sicks, MD (General) Employment:Retired           Patient Contacts [name] (Daughter)  Showing 1 of 1     [phone]         [place]  Add Guarantor                                   P/F [id number] [place] Encounter guarantor  Show all cvgs   Add Coverage        Guarantor Demographics Address linked to patient Home: [phone] Rel to patient: Self      Work:     03-04-1975 Info  Employment:Retired    Prof acct balance: 0.00 Hosp acct balance: 0.00 Add Account Note                                     1. E-MEDICARE/MEDICARE PART *   Encounter coverage  E-Verified Response History         Subscriber Demographics Grattan,Reginald Home: [phone]     Address linked to patient Work:                  Coverage Info Member ID: [id number] Group:   Rel to subscriber: Self     Subscriber ID: [id number] Effective from: 02/22/2001 [place] phone:                                    2. E-TRICARE/TRICARE FOR LIFE   Encounter coverage  E-Verified Response History         Subscriber Demographics Chipman,Ericberto Home: [phone]     Address same as patient Work:                  Coverage Info Member ID: [id number] Group:   Rel to subscriber: Self     Subscriber ID: [id number] Effective from: 06/10/2017 Auth phone:                                  Encounter [facility] Account Note        Copay due: 0.00  Copay paid: 0.00       Prepay due: 0.00  Prepay paid: 0.00                   Additional Info           Coverage Copy  The following coverages are available for copy from [place] to P/F guarantor account [address] - [place].   Payor/Plan Subscriber Name Eff From Eff To

## 2017-11-25 NOTE — Care Management (Addendum)
Consulted to make referral to [facility]. Met with patient and daughter. Daughter has been in contact with Hospice prior to admission. Met with palliative NP today and plan to return home with hospice services. He will be living at [facility] (not address listed on demographics).  He will need hospital bed, oxygen (daughter requests extended tubing), and RW. Cassandra of Hospice notified. Will fax patient information.  Will need hospital bed delivered prior to DC. Discussed with family that equipment should arrive tomorrow morning and we will arrange EMS transport for patient.

## 2017-11-25 NOTE — Telephone Encounter (Signed)
Per Dr Mannie Stabile, she called and set up hospice consult for today.

## 2017-11-25 NOTE — Progress Notes (Signed)
Progress Note  Patient Name: Tony [name] Date of Encounter: 11/25/2017  Consulting Cardiologist: Dr. Satira Sark  Subjective   Ate full breakfast this morning.  Still feels weak but breathing is somewhat better.  No chest pain.  Inpatient Medications    Scheduled Meds: . chlorhexidine  15 mL Mouth Rinse BID  . mouth rinse  15 mL Mouth Rinse q12n4p  . metoprolol tartrate  12.5 mg Oral BID   Continuous Infusions: . sodium chloride 0.45 % with kcl 125 mL/hr at 11/25/17 0444   PRN Meds: acetaminophen **OR** acetaminophen, morphine injection, traMADol   Vital Signs    Vitals:   11/24/17 1510 11/24/17 2143 11/25/17 0245 11/25/17 0615  BP: 121/61 (!) 106/59 119/79 109/66  Pulse: 72 77 89 91  Resp: 18 18 20 20   Temp: 98.3 F (36.8 C) 98.2 F (36.8 C) 98.8 F (37.1 C) 98.8 F (37.1 C)  TempSrc: Oral Oral Oral Oral  SpO2: 98% 98% 100% 100%  Weight:    194 lb (88 kg)  Height:        Intake/Output Summary (Last 24 hours) at 11/25/2017 0910 Last data filed at 11/25/2017 0600 Gross per 24 hour  Intake 2858.96 ml  Output 7600 ml  Net -4741.04 ml   Filed Weights   11/23/17 1515 11/23/17 2205 11/25/17 0615  Weight: 175 lb (79.4 kg) 194 lb 14.2 oz (88.4 kg) 194 lb (88 kg)    Telemetry    Transient atrial fibrillation noted overnight, currently sinus rhythm.  Personally reviewed.  Physical Exam   GEN:  Chronically ill-appearing elderly male. No acute distress.   Neck: No JVD. Cardiac: RRR, no gallop.  Respiratory:  Decreased breath sounds at the bases.  No wheezing.Marland Kitchen GI: Soft, nontender, bowel sounds present. MS: No edema; No deformity. Neuro:  Nonfocal. Psych: Alert and oriented x 3. Normal affect.  Labs    Chemistry Recent Labs  Lab 11/23/17 1528 11/23/17 1601 11/24/17 0211 11/25/17 0431  NA 139  --  140 141  141  K 3.2*  --  3.0* 3.2*  3.5  CL 99*  --  100* 102  102  CO2 29  --  27 27  29   GLUCOSE 105*  --  122* 97  98  BUN 37*  --  39* 31*   31*  CREATININE 4.59*  --  4.83* 3.46*  3.49*  CALCIUM 8.7*  --  8.3* 8.1*  8.2*  PROT  --  7.0 6.6  --   ALBUMIN  --  3.2* 2.9* 2.7*  AST  --  26 25  --   ALT  --  18 15*  --   ALKPHOS  --  50 47  --   BILITOT  --  0.8 0.8  --   GFRNONAA 11*  --  10* 15*  15*  GFRAA 13*  --  12* 18*  17*  ANIONGAP 11  --  13 12  10      Hematology Recent Labs  Lab 11/23/17 1528 11/24/17 0211  11/24/17 1802 11/24/17 2359 11/25/17 0431  WBC 9.2 7.7  --   --   --  8.6  RBC 3.19* 2.96*  --   --   --  2.92*  HGB 9.2* 8.7*   < > 9.3* 9.2* 8.4*  HCT 29.4* 27.2*   < > 29.2* 29.2* 27.1*  MCV 92.2 91.9  --   --   --  92.8  MCH 28.8 29.4  --   --   --  28.8  MCHC 31.3 32.0  --   --   --  31.0  RDW 14.0 14.0  --   --   --  14.0  PLT 203 194  --   --   --  201   < > = values in this interval not displayed.    Cardiac Enzymes Recent Labs  Lab 11/23/17 1528 11/23/17 2109 11/24/17 0211 11/24/17 0637  TROPONINI 0.17* 0.15* 0.14* 0.12*   No results for input(s): TROPIPOC in the last 168 hours.   BNP Recent Labs  Lab 11/23/17 1601  BNP 1,042.0*     Radiology    Ct Abdomen Pelvis Wo Contrast  Result Date: 11/23/2017 CLINICAL DATA:  Generalized weakness with nausea and shortness of breath over the last 3 weeks. EXAM: CT ABDOMEN AND PELVIS WITHOUT CONTRAST TECHNIQUE: Multidetector CT imaging of the abdomen and pelvis was performed following the standard protocol without IV contrast. COMPARISON:  Radiography same day. FINDINGS: Lower chest: Moderate to large bilateral effusions layering dependently with dependent pulmonary atelectasis and or infiltrate. Hepatobiliary: No liver parenchymal lesion is seen. The liver is somewhat small raising the possibility of cirrhosis or previous liver injury. There are a few calcified gallstones within the gallbladder. No sign of biliary obstruction or cholecystitis by CT. Pancreas: Normal Spleen: Normal Adrenals/Urinary Tract: Adrenal glands are normal. Renal  parenchymal tissue is normal on each side. Patient has bilateral hydroureteronephrosis. The bladder is markedly distended in this is probably secondary to that. Prostate gland appears enlarged. Stomach/Bowel: No active bowel pathology is seen. Diverticulosis without evidence of diverticulitis. Vascular/Lymphatic: Aortic atherosclerosis. No aneurysm. IVC is normal. No retroperitoneal adenopathy or mass. Reproductive: Enlarged prostate as noted above.  Otherwise negative. Other: No free fluid or air. Musculoskeletal: Ordinary degenerative changes affect the lumbar spine. No evidence of advanced disease or apparent spinal stenosis. IMPRESSION: Moderate to large bilateral effusions layering dependently with dependent atelectasis and or infiltrate. Distended urinary bladder. Bilateral hydroureteronephrosis, likely secondary to that. Enlarged prostate. Aortic atherosclerosis. Electronically Signed   By: Nelson Chimes M.D.   On: 11/23/2017 21:00   Dg Chest 1 View  Result Date: 11/24/2017 CLINICAL DATA:  RIGHT pleural effusion post thoracentesis EXAM: CHEST  1 VIEW COMPARISON:  11/23/2017 FINDINGS: Enlargement of cardiac silhouette. Mediastinal contour stable with atherosclerotic calcification of aorta noted. Decreased RIGHT pleural effusion and basilar atelectasis post thoracentesis. Persistent LEFT pleural effusion and basilar atelectasis. Question minimal perihilar edema. No pneumothorax following thoracentesis. Bones demineralized. IMPRESSION: No pneumothorax following RIGHT thoracentesis. Decrease in RIGHT pleural effusion and basilar atelectasis since 11/23/2017 with persistent LEFT pleural effusion and basilar atelectasis. Improved pulmonary edema. Electronically Signed   By: Lavonia Dana M.D.   On: 11/24/2017 15:11   Dg Abd Acute W/chest  Result Date: 11/23/2017 CLINICAL DATA:  Generalize weakness, shortness of breath, and nausea for the past 3 weeks. Patient reports a near syncopal episode this morning. The  patient has orthostasis. 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 EXAM: DG ABDOMEN ACUTE W/ 1V CHEST COMPARISON:  None in PACs FINDINGS: The lungs are adequately inflated. There small bilateral pleural effusions. There is no pneumothorax. The interstitial markings are coarse and become confluent at the lung bases. The heart is normal in size. The pulmonary vascularity is not engorged. There is calcification in the wall of the aortic arch. Within the abdomen there are loops of mildly distended gas-filled small bowel in the upper abdomen. On is small amount of colonic gas is present. There  is some gas and stool in the rectum. No free extraluminal gas collections are observed. There are no abnormal soft tissue calcifications. IMPRESSION: The bowel gas pattern suggests a gastroenteritis or ileus type pattern. There is no evidence of perforation or high-grade obstruction. Interstitial changes throughout both lungs which may be acute or chronic. There is bibasilar atelectasis or pneumonia. There are small bilateral pleural effusions of uncertain age. The heart and pulmonary vascularity are within the limits of normal. Thoracic aortic atherosclerosis. Electronically Signed   By: David  Martinique M.D.   On: 11/23/2017 16:53   US Thoracentesis Asp Pleural Space W/img Guide  Result Date: 11/24/2017 INDICATION: BILATERAL pleural effusions, for diagnostic and therapeutic RIGHT thoracentesis EXAM: ULTRASOUND GUIDED DIAGNOSTIC AND THERAPEUTIC RIGHT THORACENTESIS MEDICATIONS: None. COMPLICATIONS: None immediate. PROCEDURE: Procedure, benefits, and risks of procedure were discussed with patient. Written informed consent for procedure was obtained. Time out protocol followed. Pleural effusion localized by ultrasound at the posterior RIGHT hemithorax. Skin prepped and draped in usual sterile fashion. Skin and soft tissues anesthetized with 10 mL of 1% lidocaine. 8 French thoracentesis catheter  placed into the RIGHT pleural space. 1.3 L of serosanguineous to amber colored RIGHT pleural fluid aspirated by syringe pump. Procedure tolerated well by patient without immediate complication. FINDINGS: A total of approximately 1.3 L of RIGHT pleural fluid was removed. Samples were sent to the laboratory as requested by the clinical team. IMPRESSION: Successful ultrasound guided RIGHT thoracentesis yielding 1.3 L of pleural fluid. Electronically Signed   By: Lavonia Dana M.D.   On: 11/24/2017 15:14    Cardiac Studies   Echocardiogram 11/24/2017: Study Conclusions  - Left ventricle: The cavity size was normal. Wall thickness was   increased in a pattern of mild LVH. Systolic function was   severely reduced. The estimated ejection fraction was 25%.   Diffuse hypokinesis. There is hypokinesis of the anteroseptal   myocardium. There is akinesis of the inferior myocardium. The   study is not technically sufficient to allow evaluation of LV   diastolic function. - Ventricular septum: Septal motion showed dyssynergy consistent   with left bundle branch block. - Aortic valve: Mildly calcified annulus. Trileaflet; mildly   calcified leaflets. There was mild regurgitation. - Mitral valve: Mildly calcified annulus. There was moderate   regurgitation. - Right atrium: Central venous pressure (est): 3 mm Hg. - Atrial septum: No defect or patent foramen ovale was identified. - Tricuspid valve: There was mild regurgitation. - Pulmonary arteries: PA peak pressure: 45 mm Hg (S). - Pericardium, extracardiac: A moderate pericardial effusion was   identified posterior to the heart. There was a left pleural   effusion.  Patient Profile     84 y.o. male with a history of thyroid mass that has been managed conservatively without diagnosis, now presenting with weakness and failure to thrive as well as worsening shortness of breath.  Assessment & Plan    1.  Newly diagnosed cardiomyopathy, LVEF 25% with wall  motion abnormalities as detailed and also abnormal septal motion consistent with left bundle branch block.  Current cardiac marker trend not consistent with ACS to suspect recent myocardial infarction, but onset of cardiomyopathy is uncertain.  Also not certain whether this is unifying explanation for his renal failure and pleural effusions.  2.  Transient atrial fibrillation documented by telemetry, currently in sinus rhythm.  3.  Elevated troponin I levels and flat pattern most consistent with demand ischemia.  4.  Acute renal failure.  Creatinine down to 3.46.  He is autodiuresing substantially.  5.  Continued hematuria.   6.  Anemia, likely due to blood loss.  7.  Bilateral pleural effusions, status post right-sided thoracentesis with removal of 1.3 L serosanguineous to amber-colored fluid by radiology on April 2.  Discussed echocardiogram findings with patient this morning.  Patient remains full code at this time, but Palliative Care discussion is pending.  At the present time he is not a candidate for invasive cardiac evaluation, he is not a candidate for anticoagulation with active hematuria, and medication choices for cardiomyopathy are limited in light of his acute renal failure.  Would hold off on aspirin and anticoagulation agents for now.  Agree with addition of low-dose beta-blocker.  Would not use ACE inhibitor or ARB.  He is autodiuresing quite well in the absence of diuretics.  He has shown some improvement in renal function, perhaps there is a component of ATN.  Would observe for now pending further discussions.  Signed, Rozann Lesches, MD  11/25/2017, 9:10 AM

## 2017-11-25 NOTE — Consult Note (Signed)
Consultation Note Date: 11/25/2017   Tony [name] Name: Tony [name]  DOB: 29-Mar-1934  MRN: [id number]  Age / Sex: 84 y.o., male  PCP: Tony Macadam, MD Referring Physician: Barton Dubois, MD  Reason for Consultation: Establishing goals of care  HPI/Tony [name] Profile: 84 y.o. 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 Workup this admission revealed AKI (Cr trending down with IV fluids, lasix, foley w/ hematuria output), hydronephrosis, bilateral pleural effusions (thoracentesis yielded 1310m), ECHO- EF 288% systolic dysfunction, diffuse hypokinesis.  Palliative medicine consulted for Tony [name]  Clinical Assessment and Goals of Care:  I have reviewed medical records including EPIC notes, labs and imaging, assessed the Tony [name] and then met at the bedside along with Tony [name], his daughter, his spouse, and his brother  to discuss diagnosis prognosis, GOC, EOL wishes, disposition and options.  I introduced Palliative Medicine as specialized medical care for people living with serious illness. It focuses on providing relief from the symptoms and stress of a serious illness. The goal is to improve quality of life for both the Tony [name] and the family.  We discussed a brief life review of the Tony [name]. 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   As far as functional and nutritional status- there has  been significant decline- especially over the last 3 months. They have noted he has lost weight and had decrease in appetite. He appears frail. He used to enjoy going out and working in the yard on [place] feeding birds. He has been unable to do that. He has been requiring increasing assistance caring for himself and his home. His daughter notes that his home condition has deteriorated significantly and has concerns about him returning to live there.     We discussed their current illness and what it means in the larger context of their on-going co-morbidities.  Natural disease trajectory and expectations at EOL were discussed. Tony. MOstermillerhad difficulty accepting and restating the nature of his cardiac illness. He did note that he absolutely would not want any aggressive tests, or surgery for his orbital or thyroid mass- both of which have grown quite large at this point. After some discussion Tony. MFaioladid state that after he was discharged, returning to the hospital was not one of his goals. He would rather be able to be at home, and manage symptoms of pain or SOB at home if that were possible. We discussed that he may have some heart failure that may recur. He stated if there were a pill that could fix this he would like to take  it, but he didn't want to have to come back in the hospital. He understands that his life may or may not be limited- he accepts that medical providers feel that he has life limiting illnesses, and knows he is not invincible. His main GOC is to go home and stay home.    Advanced directives, concepts specific to code status, artifical feeding and hydration, and rehospitalization were considered and discussed. Tony [name] is very clear that he does not want to be resuscitated. However, he was confused about wearing the purple bracelet last night. He doesn't want to wear the bracelet.   Hospice and Palliative Care services outpatient were explained and offered. He and his  daughter would like to receive support from Hospice. They inquired about residential Hospice, but I don't think Tony [name] is close enough to end of life to qualify just yet for residential Hospice. We discussed that getting enrolled now, and receiving services at home will make for an easy transition to residential hospice when the time comes.   He complains of pain at times in his head- we discussed using oxycodone to relieve pain and potential SOB that may arise. He becomes tearful at times during our conversation and his daughter notes he sometimes has agitation or anxiety at home. He also has difficulty sleeping. We discussed using lorazepam for anxiety, and mirtazapine for sleep and appetite.   Questions and concerns were addressed.  The family was encouraged to call with questions or concerns.   Primary Decision Maker Tony [name]- with significant support from HCPOA- his daughterKatharine [name]   SUMMARY OF RECOMMENDATIONS -D/C home with Hospice after stabilized during this hospitalization- Tony [name] to go to live with his wife -Oxycodone solution- 47m po for SOB q2hr prn -lorazepam .537mq6hr prn for anxiety -mirtazapine 7.62m98mo nightly for sleep -DNR- but do not place bracelet per Tony [name] request     Code Status/Advance Care Planning:  DNR  Palliative Prophylaxis:   Delirium Protocol  Additional Recommendations (Limitations, Scope, Preferences):  Avoid Hospitalization and Minimize Medications  Prognosis:    < 6 months d/t progressing malignancy without aggressive therapy, evidence of functional, nutritional and cognitive decline, now with likely heart failure, desire to discharge home with hospice and decline to return to hospital  Discharge Planning: Home with Hospice  Primary Diagnoses: Present on Admission: . ARF (acute renal failure) ([facility] Elevated troponin . Anemia . Hypokalemia . Pleural effusion . Ischemic cardiomyopathy . Acute bilateral obstructive uropathy . Gross  hematuria   I have reviewed the medical record, interviewed the Tony [name] and family, and examined the Tony [name]. The following aspects are pertinent.  Past Medical History:  Diagnosis Date  . Appendicitis   . [place]  [facility]---Right Eye   Social History   Socioeconomic History  . Marital status: Married    Spouse name: Not on file  . Number of children: Not on file  . Years of education: Not on file  . Highest education level: Not on file  Occupational History  . Not on file  Social Needs  . Financial resource strain: Not on file  . Food insecurity:    Worry: Not on file    Inability: Not on file  . Transportation needs:    Medical: Not on file    Non-medical: Not on file  Tobacco Use  . Smoking status: Never Smoker  . Smokeless tobacco: Never Used  Substance and Sexual Activity  . Alcohol use: No  . Drug use: No  .  Sexual activity: Never  Lifestyle  . Physical activity:    Days per week: Not on file    Minutes per session: Not on file  . Stress: Not on file  Relationships  . Social connections:    Talks on phone: Not on file    Gets together: Not on file    Attends religious service: Not on file    Active member of club or organization: Not on file    Attends meetings of clubs or organizations: Not on file    Relationship status: Not on file  Other Topics Concern  . Not on file  Social History Narrative   Live in [place], [place]. Lives alone. Cooks at home and eats out at times. Had three children, one is decreased.    Family History  Problem Relation Age of Onset  . Asthma Father    Scheduled Meds: . chlorhexidine  15 mL Mouth Rinse BID  . mouth rinse  15 mL Mouth Rinse q12n4p  . metoprolol tartrate  12.5 mg Oral BID   Continuous Infusions: . sodium chloride 0.45 % with kcl 125 mL/hr at 11/25/17 0444   PRN Meds:.acetaminophen **OR** acetaminophen, morphine injection, oxyCODONE, traMADol Medications Prior to Admission:  Prior to Admission  medications   Medication Sig Start Date End Date Taking? Authorizing Provider  ASPIRIN ADULT PO Take 0.5 tablets by mouth once as needed (for pain-headache).   Yes [provider]   Allergies  Allergen Reactions  . Talc Itching and Rash   Review of Systems  Constitutional: Positive for activity change, fatigue and unexpected weight change.  Eyes: Positive for visual disturbance.  Respiratory: Positive for shortness of breath.   Psychiatric/Behavioral: Positive for sleep disturbance.    Physical Exam  Constitutional: He appears ill.  Thin, frail  Eyes:  L eye with bandage in place  Cardiovascular:  tachycardic  Pulmonary/Chest:  Easily SOB when talking  Neurological: He is alert.  Skin: There is pallor.  Psychiatric:  Pleasant, tearful at time, easily distracted  Nursing note and vitals reviewed.   Vital Signs: BP 114/64   Pulse 86   Temp 98.8 F (37.1 C) (Oral)   Resp 20   Ht 6' 1"  (1.854 m)   Wt 88 kg (194 lb)   SpO2 100%   BMI 25.60 kg/m  Pain Scale: 0-10   Pain Score: 0-No pain   SpO2: SpO2: 100 % O2 Device:SpO2: 100 % O2 Flow Rate: .O2 Flow Rate (L/min): 2 L/min  IO: Intake/output summary:   Intake/Output Summary (Last 24 hours) at 11/25/2017 1249 Last data filed at 11/25/2017 0948 Gross per 24 hour  Intake 2978.96 ml  Output 6800 ml  Net -3821.04 ml    LBM: Last BM Date: 11/23/17 Baseline Weight: Weight: 79.4 kg (175 lb) Most recent weight: Weight: 88 kg (194 lb)     Palliative Assessment/Data: PPS: 30%     Thank you for this consult. Palliative medicine will continue to follow and assist as needed.   Time In: 1130 Time Out: 1315 Time Total: 105 mins Prolonged services billed: yes Greater than 50%  of this time was spent counseling and coordinating care related to the above assessment and plan.  Signed by: Mariana Kaufman, AGNP-C Palliative Medicine    Please contact Palliative Medicine Team phone at [phone] for questions and  concerns.  For individual provider: See Shea Evans

## 2017-11-25 NOTE — Progress Notes (Signed)
Tony [name]  MRN: [id number]  DOB/AGE: [birth year] 84 y.o.  Primary Care Physician:Hagler, Apolonio Schneiders, MD  Admit date: 11/23/2017  Chief Complaint:  Chief Complaint  Patient presents with  . Nausea  . Weakness  . Shortness of Breath    S-Pt presented on  11/23/2017 with  Chief Complaint  Patient presents with  . Nausea  . Weakness  . Shortness of Breath  .    Pt today feels better.    Pt daughter was present in the room.   Meds . chlorhexidine  15 mL Mouth Rinse BID  . mouth rinse  15 mL Mouth Rinse q12n4p  . metoprolol tartrate  12.5 mg Oral BID       Physical Exam: Vital signs in last 24 hours: Temp:  [98.2 F (36.8 C)-98.8 F (37.1 C)] 98.8 F (37.1 C) (04/03 0615) Pulse Rate:  [72-91] 91 (04/03 0615) Resp:  [18-20] 20 (04/03 0615) BP: (105-131)/(59-112) 109/66 (04/03 0615) SpO2:  [98 %-100 %] 100 % (04/03 0615) Weight:  [194 lb (88 kg)] 194 lb (88 kg) (04/03 0615) Weight change: 19 lb (8.618 kg) Last BM Date: 11/23/17  Intake/Output from previous day: 04/02 0701 - 04/03 0700 In: 3099 [P.O.:1080; I.V.:2019] Out: 8600 [Urine:7300] Total I/O In: 360 [P.O.:360] Out: -    Physical Exam: General- pt is awake,alert, oriented to time place and person Resp- No acute REsp distress, CTA B/L NO Rhonchi CVS- S1S2 regular ij rate and rhythm GIT- BS+, soft, NT, ND EXT- 1+  LE Edema, NO Cyanosis   Lab Results: CBC Recent Labs    11/24/17 0211  11/24/17 2359 11/25/17 0431  WBC 7.7  --   --  8.6  HGB 8.7*   < > 9.2* 8.4*  HCT 27.2*   < > 29.2* 27.1*  PLT 194  --   --  201   < > = values in this interval not displayed.    BMET Recent Labs    11/24/17 0211 11/25/17 0431  NA 140 141  141  K 3.0* 3.2*  3.5  CL 100* 102  102  CO2 27 27  29   GLUCOSE 122* 97  98  BUN 39* 31*  31*  CREATININE 4.83* 3.46*  3.49*  CALCIUM 8.3* 8.1*  8.2*    Creat Trend 2019  4.59=> 4.83=>3.49 2018  1.06  MICRO Recent Results (from the past 240 hour(s))   Culture, body fluid-bottle     Status: None (Preliminary result)   Collection Time: 11/24/17  2:43 PM  Result Value Ref Range Status   Specimen Description FLUID RIGHT THORACENTESIS  Final   Special Requests BOTTLES DRAWN AEROBIC AND ANAEROBIC 10CC EACH  Final   Culture   Final    NO GROWTH < 24 HOURS Performed at [facility], [address], [place], [place] [address]    Report Status PENDING  Incomplete  Gram stain     Status: None   Collection Time: 11/24/17  2:43 PM  Result Value Ref Range Status   Specimen Description FLUID RIGHT THORACENTESIS  Final   Special Requests NONE  Final   Gram Stain   Final    CYTOSPIN SMEAR WBC PRESENT, PREDOMINANTLY MONONUCLEAR NO ORGANISMS SEEN Performed at [facility] Performed at [facility], [address] [place],  [address]    Report Status 11/24/2017 FINAL  Final      Lab Results  Component Value Date   CALCIUM 8.1 (L) 11/25/2017   CALCIUM 8.2 (L) 11/25/2017  PHOS 3.7 11/25/2017               Impression: 1)Renal  AKI secondary to Post renal                AKI on CKD               CKD stage 2/3.               CKD since 2018( most likeley before that)                CKD secondary to age associated decline as pt is more than 84 years old               Progression of CKD now marked with AKI                Hematuria  Present                AKI improving               Creat trending down  2)HTN  Medication- On Beta blockers   3)Anemia HGb low  4)Urology-Pt admitted with b/l hydronephrosis Primary team and Urology following  5)Resp-admitted with pleural effusions S/p tap Primary MD following  6)Electrolytes Hypokalemic  better  NOrmonatremic   7)Acid base Co2 at goal     Plan:  Will continue current care.      [place] S 11/25/2017, 10:21 AM

## 2017-11-25 NOTE — Progress Notes (Signed)
PROGRESS NOTE                                                                                                                                                                                                             Patient Demographics:    Tony [name], is a 84 y.o. male, DOB - [birth year], GQQ:[id number]  Admit date - 11/23/2017   Admitting Physician Jani Gravel, MD  Outpatient Primary MD for the patient is [name], MD  LOS - 2  Outpatient Specialists: none  Chief Complaint  Patient presents with  . Nausea  . Weakness  . Shortness of Breath       Brief Narrative   84 year old male with basal cell carcinoma of the left eyelid/face/orbit, for several years (declined treatment) and possible thyroid cancer (as per biopsy in November 2018) but declined further treatment or workup presented with nausea, poor appetite and dizziness for the past 3 weeks.  Also reported discomfort in his lower abdomen for the same duration.  He does report having increased dyspnea on exertion and unable to lie down flat for the last few days.  He also has noticed increased bleeding from the basal cell carcinoma over the left eye.  Also reports increased urinary urgency and hesitancy.  denies any fever, headache, dizziness, nausea, vomiting, chest pain, palpitations, dysuria or diarrhea.  He denies any new medications, recent illness.  He has been doing his own dressing changes for his basal cell carcinoma.  He had MRI of the brain done in November 2018 had [facility] showing enhancing mass extending to the retrobulbar soft tissue area and involving multiple extraocular muscles, left globe and extending into the left maxillary sinus.  He was offered surgical resection by ENT but seems to have declined. In the ED he was found to be in acute kidney injury with creatinine of 4.59, BUN of 37, BNP of 1000 and elevated troponin of 0.17.  Chest x-ray showed  moderate to large bilateral effusions.  CT of the abdomen and pelvis showed distended urinary bladder with bilateral hydroureteronephrosis and enlarged prostate.  Admitted to telemetry for further management.  Cardiology and nephrology consulted.  During the night he was found to have difficulty voiding with bladder scan showing 800 cc retained urine.  Foley was placed in with frank hematuria on the back with multiple clots.  Urology was consulted who recommended intermittent irrigation.    Subjective:   No CP, improved in his Dyspnea and orthopnea. Renal function showing some improvements. Still with hematuria and has conversion of rate controlled A. Fib overnight.   Assessment  & Plan :    Principal Problem: Acute kidney injury ([place]) -Suspect due to obstructive uropathy +/- prerenal.   -Nephrology consult appreciated.   -Monitor renal function with Foley placed in.   -Avoid nephrotoxins. -renal function trended down some -overall plan is for comfort management and symptomatic care now.   Elevated troponin with acute systolic CHF -Demand ischemia with acute cardiomyopathy.   -Hypokinesis of anterior septal myocardium on 2D echo with EF of 25%.   -Not a good candidate for any intervention.  Cardiology consult appreciated and recommend palliative care consult. -patient and family updated and ok with pursuit of home hospice. -will continue low dose b-blocker -patient with good autodiuresis off lasix after foley placed -Underwent right thoracentesis with 1.3 L serosanguineous fluid removed.  Dyspnea improved.  Follow cell count, culture and cytology. -no ACE or ARB with bad renal function  Gross hematuria -?  Underlying malignancy with bladder outlet obstruction.  -Continue Foley with intermittent bladder irrigation. -family will to see urology as an outpatient with intentions of evaluation to see if foley can be removed and by that decrease risk of future infections.  Anemia of  chronic disease due to CKD; with acute component due to ABL -patient with active hematuria  -ok to be transfused if needed -will follow Hgb intermittently  Hypokalemia -repleted -will follow electrolytes  Basal cell carcinoma of the left eye -Extensive lesion.  Patient refused surgical treatment. -no interested in further evaluation.  ?  Thyroid malignancy -As per biopsy in 06/2017.  Refused further treatment. -planning comfort and symptom management only.   Code Status : DNR   Family Communication  : Discussed with daughter and brother at bedside  Disposition Plan  : Planning for discharge home with hospice.  Barriers For Discharge : Active symptoms  Consults  : Cardiology Nephrology Palliative care  Procedures  : 2D echo CT abdomen pelvis Ultrasound thoracentesis  DVT Prophylaxis  : SCDs  Lab Results  Component Value Date   PLT 201 11/25/2017    Antibiotics  :    Anti-infectives (From admission, onward)   None        Objective:   Vitals:   11/25/17 0615 11/25/17 1100 11/25/17 1606 11/25/17 2023  BP: 109/66 114/64 (!) 101/58 125/71  Pulse: 91 86 (!) 34 99  Resp: 20  18 18   Temp: 98.8 F (37.1 C)  99.4 F (37.4 C) 98.9 F (37.2 C)  TempSrc: Oral  Oral Oral  SpO2: 100%  98% 100%  Weight: 88 kg (194 lb)     Height:        Wt Readings from Last 3 Encounters:  11/25/17 88 kg (194 lb)  06/25/17 84.7 kg (186 lb 12 oz)  06/10/17 81.6 kg (180 lb)     Intake/Output Summary (Last 24 hours) at 11/25/2017 2118 Last data filed at 11/25/2017 1825 Gross per 24 hour  Intake 3813.54 ml  Output 4251 ml  Net -437.46 ml    Physical Exam General: elderly male, in no acute distress, AAOX3 and in no acute distress. Patient denies CP and reported improvement in his breathing. With overnight event rate controlled of A. Fib and ongoing hematuria. HEENT: pale conjunctiva, patch over left eye, fair dentition, no erythema or exudates in  his pharynx; no  thrush. Chest: fine crackles at the bases, no wheezing; positive rhonchi. CVS: rate controlled, no rubs, no gallops, no murmur GI: soft, NT, ND, positive BS Musculoskeletal: trace edema bilaterally, no cyanosis, no clubbing  CNS: AAOX3, no focal deficit, CN intact.    Data Review:    CBC Recent Labs  Lab 11/23/17 1528  11/24/17 0211 11/24/17 0637 11/24/17 1213 11/24/17 1802 11/24/17 2359 11/25/17 0431  WBC 9.2  --  7.7  --   --   --   --  8.6  HGB 9.2*  --  8.7* 9.0* 9.2* 9.3* 9.2* 8.4*  HCT 29.4*   < > 27.2* 28.4* 28.9* 29.2* 29.2* 27.1*  PLT 203  --  194  --   --   --   --  201  MCV 92.2  --  91.9  --   --   --   --  92.8  MCH 28.8  --  29.4  --   --   --   --  28.8  MCHC 31.3  --  32.0  --   --   --   --  31.0  RDW 14.0  --  14.0  --   --   --   --  14.0  LYMPHSABS 1.5  --   --   --   --   --   --   --   MONOABS 1.0  --   --   --   --   --   --   --   EOSABS 0.1  --   --   --   --   --   --   --   BASOSABS 0.0  --   --   --   --   --   --   --    < > = values in this interval not displayed.    Chemistries  Recent Labs  Lab 11/23/17 1528 11/23/17 1601 11/24/17 0211 11/25/17 0431  NA 139  --  140 141  141  K 3.2*  --  3.0* 3.2*  3.5  CL 99*  --  100* 102  102  CO2 29  --  27 27  29   GLUCOSE 105*  --  122* 97  98  BUN 37*  --  39* 31*  31*  CREATININE 4.59*  --  4.83* 3.46*  3.49*  CALCIUM 8.7*  --  8.3* 8.1*  8.2*  AST  --  26 25  --   ALT  --  18 15*  --   ALKPHOS  --  50 47  --   BILITOT  --  0.8 0.8  --    Recent Labs    11/23/17 2100  VITAMINB12 221  FERRITIN 164  TIBC 244*  IRON 23*   Cardiac Enzymes Recent Labs  Lab 11/23/17 2109 11/24/17 0211 11/24/17 0637  TROPONINI 0.15* 0.14* 0.12*   ------------------------------------------------------------------------------------------------------------------    Component Value Date/Time   BNP 1,042.0 (H) 11/23/2017 1601    Inpatient Medications  Scheduled Meds: . chlorhexidine   15 mL Mouth Rinse BID  . mouth rinse  15 mL Mouth Rinse q12n4p  . metoprolol tartrate  12.5 mg Oral BID   Continuous Infusions: . sodium chloride 0.45 % with kcl 125 mL/hr at 11/25/17 2044   Micro Results Recent Results (from the past 240 hour(s))  Culture, body fluid-bottle     Status: None (Preliminary result)   Collection Time: 11/24/17  2:43 PM  Result Value Ref Range Status   Specimen Description FLUID RIGHT THORACENTESIS  Final   Special Requests BOTTLES DRAWN AEROBIC AND ANAEROBIC 10CC EACH  Final   Culture   Final    NO GROWTH < 24 HOURS Performed at [facility], [address], [place], [place] [address]    Report Status PENDING  Incomplete  Gram stain     Status: None   Collection Time: 11/24/17  2:43 PM  Result Value Ref Range Status   Specimen Description FLUID RIGHT THORACENTESIS  Final   Special Requests NONE  Final   Gram Stain   Final    CYTOSPIN SMEAR WBC PRESENT, PREDOMINANTLY MONONUCLEAR NO ORGANISMS SEEN Performed at [facility] Performed at [facility], [address] [place], [place] [address]    Report Status 11/24/2017 FINAL  Final    Radiology Reports Ct Abdomen Pelvis Wo Contrast  Result Date: 11/23/2017 CLINICAL DATA:  Generalized weakness with nausea and shortness of breath over the last 3 weeks. EXAM: CT ABDOMEN AND PELVIS WITHOUT CONTRAST TECHNIQUE: Multidetector CT imaging of the abdomen and pelvis was performed following the standard protocol without IV contrast. COMPARISON:  Radiography same day. FINDINGS: Lower chest: Moderate to large bilateral effusions layering dependently with dependent pulmonary atelectasis and or infiltrate. Hepatobiliary: No liver parenchymal lesion is seen. The liver is somewhat small raising the possibility of cirrhosis or previous liver injury. There are a few calcified gallstones within the gallbladder. No sign of biliary obstruction or cholecystitis by CT. Pancreas: Normal Spleen: Normal Adrenals/Urinary Tract:  Adrenal glands are normal. Renal parenchymal tissue is normal on each side. Patient has bilateral hydroureteronephrosis. The bladder is markedly distended in this is probably secondary to that. Prostate gland appears enlarged. Stomach/Bowel: No active bowel pathology is seen. Diverticulosis without evidence of diverticulitis. Vascular/Lymphatic: Aortic atherosclerosis. No aneurysm. IVC is normal. No retroperitoneal adenopathy or mass. Reproductive: Enlarged prostate as noted above.  Otherwise negative. Other: No free fluid or air. Musculoskeletal: Ordinary degenerative changes affect the lumbar spine. No evidence of advanced disease or apparent spinal stenosis. IMPRESSION: Moderate to large bilateral effusions layering dependently with dependent atelectasis and or infiltrate. Distended urinary bladder. Bilateral hydroureteronephrosis, likely secondary to that. Enlarged prostate. Aortic atherosclerosis. Electronically Signed   By: Nelson Chimes M.D.   On: 11/23/2017 21:00   Dg Chest 1 View  Result Date: 11/24/2017 CLINICAL DATA:  RIGHT pleural effusion post thoracentesis EXAM: CHEST  1 VIEW COMPARISON:  11/23/2017 FINDINGS: Enlargement of cardiac silhouette. Mediastinal contour stable with atherosclerotic calcification of aorta noted. Decreased RIGHT pleural effusion and basilar atelectasis post thoracentesis. Persistent LEFT pleural effusion and basilar atelectasis. Question minimal perihilar edema. No pneumothorax following thoracentesis. Bones demineralized. IMPRESSION: No pneumothorax following RIGHT thoracentesis. Decrease in RIGHT pleural effusion and basilar atelectasis since 11/23/2017 with persistent LEFT pleural effusion and basilar atelectasis. Improved pulmonary edema. Electronically Signed   By: Lavonia Dana M.D.   On: 11/24/2017 15:11   Dg Abd Acute W/chest  Result Date: 11/23/2017 CLINICAL DATA:  Generalize weakness, shortness of breath, and nausea for the past 3 weeks. Patient reports a near  syncopal episode this morning. The patient has orthostasis. 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 EXAM: DG ABDOMEN ACUTE W/ 1V CHEST COMPARISON:  None in PACs FINDINGS: The lungs are adequately inflated. There small bilateral pleural effusions. There is no pneumothorax. The interstitial markings are coarse and become confluent at the lung bases. The heart is normal in size.  The pulmonary vascularity is not engorged. There is calcification in the wall of the aortic arch. Within the abdomen there are loops of mildly distended gas-filled small bowel in the upper abdomen. On is small amount of colonic gas is present. There is some gas and stool in the rectum. No free extraluminal gas collections are observed. There are no abnormal soft tissue calcifications. IMPRESSION: The bowel gas pattern suggests a gastroenteritis or ileus type pattern. There is no evidence of perforation or high-grade obstruction. Interstitial changes throughout both lungs which may be acute or chronic. There is bibasilar atelectasis or pneumonia. There are small bilateral pleural effusions of uncertain age. The heart and pulmonary vascularity are within the limits of normal. Thoracic aortic atherosclerosis. Electronically Signed   By: David  Martinique M.D.   On: 11/23/2017 16:53   US Thoracentesis Asp Pleural Space W/img Guide  Result Date: 11/24/2017 INDICATION: BILATERAL pleural effusions, for diagnostic and therapeutic RIGHT thoracentesis EXAM: ULTRASOUND GUIDED DIAGNOSTIC AND THERAPEUTIC RIGHT THORACENTESIS MEDICATIONS: None. COMPLICATIONS: None immediate. PROCEDURE: Procedure, benefits, and risks of procedure were discussed with patient. Written informed consent for procedure was obtained. Time out protocol followed. Pleural effusion localized by ultrasound at the posterior RIGHT hemithorax. Skin prepped and draped in usual sterile fashion. Skin and soft tissues anesthetized with 10 mL of 1%  lidocaine. 8 French thoracentesis catheter placed into the RIGHT pleural space. 1.3 L of serosanguineous to amber colored RIGHT pleural fluid aspirated by syringe pump. Procedure tolerated well by patient without immediate complication. FINDINGS: A total of approximately 1.3 L of RIGHT pleural fluid was removed. Samples were sent to the laboratory as requested by the clinical team. IMPRESSION: Successful ultrasound guided RIGHT thoracentesis yielding 1.3 L of pleural fluid. Electronically Signed   By: Lavonia Dana M.D.   On: 11/24/2017 15:14    Time Spent: 30 minutes     Barton Dubois M.D on 11/25/2017 at 9:18 PM  Between 7am to 7pm - Pager - [phone]  After 7pm go to [email or web address] - password [facility]  [facility] -  Office  [phone]

## 2017-11-26 DIAGNOSIS — I5021 Acute systolic (congestive) heart failure: Secondary | ICD-10-CM

## 2017-11-26 DIAGNOSIS — D62 Acute posthemorrhagic anemia: Secondary | ICD-10-CM

## 2017-11-26 DIAGNOSIS — R310 Gross hematuria: Secondary | ICD-10-CM

## 2017-11-26 DIAGNOSIS — R339 Retention of urine, unspecified: Secondary | ICD-10-CM

## 2017-11-26 LAB — CBC
HEMATOCRIT: 28.8 % — AB (ref 39.0–52.0)
Hemoglobin: 8.8 g/dL — ABNORMAL LOW (ref 13.0–17.0)
MCH: 28.5 pg (ref 26.0–34.0)
MCHC: 30.6 g/dL (ref 30.0–36.0)
MCV: 93.2 fL (ref 78.0–100.0)
PLATELETS: 176 10*3/uL (ref 150–400)
RBC: 3.09 MIL/uL — ABNORMAL LOW (ref 4.22–5.81)
RDW: 14.3 % (ref 11.5–15.5)
WBC: 7.5 10*3/uL (ref 4.0–10.5)

## 2017-11-26 LAB — BASIC METABOLIC PANEL
Anion gap: 11 (ref 5–15)
BUN: 26 mg/dL — AB (ref 6–20)
CALCIUM: 7.9 mg/dL — AB (ref 8.9–10.3)
CO2: 27 mmol/L (ref 22–32)
Chloride: 102 mmol/L (ref 101–111)
Creatinine, Ser: 2.48 mg/dL — ABNORMAL HIGH (ref 0.61–1.24)
GFR calc Af Amer: 26 mL/min — ABNORMAL LOW (ref >60)
GFR, EST NON AFRICAN AMERICAN: 23 mL/min — AB (ref >60)
GLUCOSE: 99 mg/dL (ref 65–99)
Potassium: 3.4 mmol/L — ABNORMAL LOW (ref 3.5–5.1)
Sodium: 140 mmol/L (ref 135–145)

## 2017-11-26 LAB — MAGNESIUM: Magnesium: 1.5 mg/dL — ABNORMAL LOW (ref 1.7–2.4)

## 2017-11-26 MED ORDER — POLYSACCHARIDE IRON COMPLEX 150 MG PO CAPS
150.0000 mg | ORAL_CAPSULE | Freq: Every day | ORAL | Status: DC
  Administered 2017-11-26: 150 mg via ORAL
  Filled 2017-11-26: qty 1

## 2017-11-26 MED ORDER — OXYCODONE HCL 5 MG/5ML PO SOLN: 5.0000 mg | ORAL | 0 refills | Status: AC | PRN

## 2017-11-26 MED ORDER — POLYSACCHARIDE IRON COMPLEX 150 MG PO CAPS: 150.0000 mg | ORAL_CAPSULE | Freq: Every day | ORAL | 0 refills | Status: AC

## 2017-11-26 MED ORDER — ACETAMINOPHEN 325 MG PO TABS: 650.0000 mg | ORAL_TABLET | Freq: Four times a day (QID) | ORAL | 0 refills | Status: AC | PRN

## 2017-11-26 MED ORDER — LORAZEPAM 0.5 MG PO TABS: 0.5000 mg | ORAL_TABLET | Freq: Four times a day (QID) | ORAL | 0 refills | Status: AC | PRN

## 2017-11-26 MED ORDER — METOPROLOL TARTRATE 25 MG PO TABS: 12.5000 mg | ORAL_TABLET | Freq: Two times a day (BID) | ORAL | 0 refills | Status: AC

## 2017-11-26 NOTE — Care Management (Signed)
EMS transport arranged. Left Voicemail with daughter. Notified Cassandra of Hospice. Notified patient's wife, who will be receiving patient in the home.

## 2017-11-26 NOTE — Progress Notes (Signed)
Subjective: Interval History: has no complaint of nausea or vomiting.  Patient also denies any difficulty breathing.  His appetite is good.  Objective: Vital signs in last 24 hours: Temp:  [97.5 F (36.4 C)-99.4 F (37.4 C)] 97.5 F (36.4 C) (04/04 0634) Pulse Rate:  [34-100] 100 (04/04 0957) Resp:  [18] 18 (04/04 0634) BP: (101-125)/(58-71) 114/60 (04/04 0957) SpO2:  [98 %-100 %] 99 % (04/04 0634) Weight:  [88 kg (194 lb)] 88 kg (194 lb) (04/04 0700) Weight change: 0 kg (0 lb)  Intake/Output from previous day: 04/03 0701 - 04/04 0700 In: 3758.3 [P.O.:600; I.V.:3158.3] Out: 4301 [Urine:4300; Stool:1] Intake/Output this shift: Total I/O In: 240 [P.O.:240] Out: -   General appearance: alert, cooperative and no distress Resp: clear to auscultation bilaterally Cardio: regular rate and rhythm Extremities: No edema  Lab Results: Recent Labs    11/25/17 0431 11/26/17 0619  WBC 8.6 7.5  HGB 8.4* 8.8*  HCT 27.1* 28.8*  PLT 201 176   BMET:  Recent Labs    11/25/17 0431 11/26/17 0619  NA 141  141 140  K 3.2*  3.5 3.4*  CL 102  102 102  CO2 27  29 27   GLUCOSE 97  98 99  BUN 31*  31* 26*  CREATININE 3.46*  3.49* 2.48*  CALCIUM 8.1*  8.2* 7.9*   No results for input([name]): PTH in the last 72 hours. Iron Studies:  Recent Labs    11/23/17 2100  IRON 23*  TIBC 244*  FERRITIN 164    Studies/Results: Dg Chest 1 View  Result Date: 11/24/2017 CLINICAL DATA:  RIGHT pleural effusion post thoracentesis EXAM: CHEST  1 VIEW COMPARISON:  11/23/2017 FINDINGS: Enlargement of cardiac silhouette. Mediastinal contour stable with atherosclerotic calcification of aorta noted. Decreased RIGHT pleural effusion and basilar atelectasis post thoracentesis. Persistent LEFT pleural effusion and basilar atelectasis. Question minimal perihilar edema. No pneumothorax following thoracentesis. Bones demineralized. IMPRESSION: No pneumothorax following RIGHT thoracentesis. Decrease in RIGHT  pleural effusion and basilar atelectasis since 11/23/2017 with persistent LEFT pleural effusion and basilar atelectasis. Improved pulmonary edema. Electronically Signed   By: Lavonia Dana M.D.   On: 11/24/2017 15:11   US Thoracentesis Asp Pleural Space W/img Guide  Result Date: 11/24/2017 INDICATION: BILATERAL pleural effusions, for diagnostic and therapeutic RIGHT thoracentesis EXAM: ULTRASOUND GUIDED DIAGNOSTIC AND THERAPEUTIC RIGHT THORACENTESIS MEDICATIONS: None. COMPLICATIONS: None immediate. PROCEDURE: Procedure, benefits, and risks of procedure were discussed with patient. Written informed consent for procedure was obtained. Time out protocol followed. Pleural effusion localized by ultrasound at the posterior RIGHT hemithorax. Skin prepped and draped in usual sterile fashion. Skin and soft tissues anesthetized with 10 mL of 1% lidocaine. 8 French thoracentesis catheter placed into the RIGHT pleural space. 1.3 L of serosanguineous to amber colored RIGHT pleural fluid aspirated by syringe pump. Procedure tolerated well by patient without immediate complication. FINDINGS: A total of approximately 1.3 L of RIGHT pleural fluid was removed. Samples were sent to the laboratory as requested by the clinical team. IMPRESSION: Successful ultrasound guided RIGHT thoracentesis yielding 1.3 L of pleural fluid. Electronically Signed   By: Lavonia Dana M.D.   On: 11/24/2017 15:14    I have reviewed the patient'[name] current medications.  Assessment/Plan: 1] acute kidney injury: Obstructive.  Presently patient is polyuric.  His creatinine is 2.48 progressively improving. 2] hypokalemia: His potassium is 3.4 improving 3] anemia: His hemoglobin is low.  Possibly iron deficiency anemia 4] bone and mineral disorder: His calcium and phosphorus is range 5] BPH:  Patient presently with Foley catheter and hematuria has improved. 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 Plan: We will continue his present management 2] we will check  his renal panel in the morning. 3] we will check CBC in the a.m. 4] we will start patient on Nu-Iron 150 mg once a day    LOS: 3 days   Tony [name] 11/26/2017,10:12 AM

## 2017-11-26 NOTE — Progress Notes (Signed)
No charge note:   Spoke with patient's daughterKatharine Look- via phone- she was very thankful and pleased with plan for her father's disposition and care. Has been connected with [facility] and plan is for discharge today after home equipment is delivered. She inquired about possible procedure that was discussed with urologist that could be performed palliatively for urinary retention. Encouraged her to discuss this with Hospice provider as this would meet GOC of improving quality of life and living with dignity for what time of life patient has.   Mariana Kaufman, AGNP-C Palliative Medicine  Please call Palliative Medicine team phone with any questions [phone]. For individual providers please see AMION.

## 2017-11-26 NOTE — Progress Notes (Signed)
Discharge instructions read to pt daughter [name].  She verbalized understanding of all instructions. Pt discharged to home via EMS.

## 2017-11-26 NOTE — Progress Notes (Signed)
   Progress Note  Patient Name: Tony [name] Date of Encounter: 11/26/2017  I reviewed the hospital course and chart since progress note from yesterday.  Patient has been seen by Palliative Care team with plan for outpatient Hospice.  No further cardiac testing is planned at this point.  We will continue low-dose beta-blocker.  We will sign off.  Signed, Rozann Lesches, MD  11/26/2017, 8:18 AM

## 2017-11-26 NOTE — Discharge Summary (Signed)
Physician Discharge Summary  Tony [name] EXB:[id number] DOB: [birth year] DOA: 11/23/2017  PCP: [name], MD  Admit date: 11/23/2017 Discharge date: 11/26/2017  Time spent: 35 minutes  Recommendations for Outpatient Follow-up:  1. Follow up with urology to further evaluate bladder outlet obstruction and possibility for foley catheter removal. 2. Active hospice follow up, meanly symptomatic management and comfort care. 3. Per family request assess Hgb trend and also renal function/electrolytes.   Discharge Diagnoses:  Principal Problem:   ARF (acute renal failure) (HCC) Active Problems:   Elevated troponin   Anemia   Hypokalemia   Dizziness   Pleural effusion   Ischemic cardiomyopathy   Acute bilateral obstructive uropathy   Gross hematuria   Goals of care, counseling/discussion   Advance care planning   Palliative care by specialist   Urinary retention   Acute blood loss anemia   Acute systolic CHF (congestive heart failure) ([facility])   Discharge Condition: Stable and improved.  Patient discharged home with hospice care arrange; oxygen supplementation and equipment has been delivered.  We will follow-up with PCP in 1 week and also with urologist in the next 10 days.  Diet recommendation: regular diet/comfort feeding (monitoring sodium intake)  Filed Weights   11/23/17 2205 11/25/17 0615 11/26/17 0700  Weight: 88.4 kg (194 lb 14.2 oz) 88 kg (194 lb) 88 kg (194 lb)    History of present illness:  As per H&P written by Dr. Maudie Mercury on  11/23/17 84 year old male with basal cell carcinoma of the left eyelid/face/orbit, for several years (declined treatment) and possible thyroid cancer (as per biopsy in November 2018) but declined further treatment or workup presented with nausea, poor appetite and dizziness for the past 3 weeks.  Also reported discomfort in his lower abdomen for the same duration.  He does report having increased dyspnea on exertion and unable to lie down flat for the last  few days.  He also has noticed increased bleeding from the basal cell carcinoma over the left eye.  Also reports increased urinary urgency and hesitancy.  denies any fever, headache, dizziness, nausea, vomiting, chest pain, palpitations, dysuria or diarrhea.  He denies any new medications, recent illness.  He has been doing his own dressing changes for his basal cell carcinoma.  He had MRI of the brain done in November 2018 had [facility] showing enhancing mass extending to the retrobulbar soft tissue area and involving multiple extraocular muscles, left globe and extending into the left maxillary sinus.  He was offered surgical resection by ENT but seems to have declined. In the ED he was found to be in acute kidney injury with creatinine of 4.59, BUN of 37, BNP of 1000 and elevated troponin of 0.17.  Chest x-ray showed moderate to large bilateral effusions.  CT of the abdomen and pelvis showed distended urinary bladder with bilateral hydroureteronephrosis and enlarged prostate.  Admitted to telemetry for further management.  Cardiology and nephrology consulted.   Hospital Course:  Acute kidney injury ([place]) -Suspect due to obstructive uropathy +/- prerenal.   -Nephrology consult appreciated.   -Monitor renal function with Foley placed in.   -Avoid nephrotoxins. -renal function trended down some -overall plan is for comfort management and symptomatic care now.   Elevated troponin with acute systolic CHF -Demand ischemia with acute cardiomyopathy.   -Hypokinesis of anterior septal myocardium on 2D echo with EF of 25%.   -Not a good candidate for any intervention.  Cardiology consult appreciated and recommend palliative care consult. -patient and family updated and  ok with pursuit of home hospice. -will continue low dose b-blocker -patient with good autodiuresis off lasix after foley placed -Underwent right thoracentesis with 1.3 L serosanguineous fluid removed.  Dyspnea improved.  Follow cell  count, culture and cytology. -no ACE or ARB with bad renal function  Gross hematuria -?  Underlying malignancy with bladder outlet obstruction.  -Continue Foley with intermittent bladder irrigation. -family will to see urology as an outpatient with intentions of evaluation to see if foley can be removed and by that decrease risk of future infections.  Anemia of chronic disease due to CKD; with acute component due to ABL -patient with active hematuria  -ok to be transfused if needed -will follow Hgb intermittently  Hypokalemia -repleted -will follow electrolytes  Basal cell carcinoma of the left eye -Extensive lesion.  Patient refused surgical treatment. -no interested in further evaluation.  ?  Thyroid malignancy -As per biopsy in 06/2017.  Refused further treatment. -planning comfort and symptom management only.   Procedures: -2D echo -CT abdomen pelvis -Ultrasound thoracentesis  Consultations:  Cardiology   Nephrology   Palliative Care  Discharge Exam: Vitals:   11/26/17 0957 11/26/17 1125  BP: 114/60   Pulse: 100   Resp:    Temp:    SpO2:  93%   General: elderly male, AAOX3 and in no acute distress. Patient denies CP and reported much improvement in his breathing. Overall with controlled HR and noticing increase in urine output. HEENT: pale conjunctiva, patch over left eye, fair dentition, no erythema or exudates in his pharynx; no thrush. Chest: no frank crackles; decrease BS at the bases, no wheezing; positive rhonchi. CVS: rate controlled, no rubs, no gallops, no murmur GI: soft, NT, ND, positive BS Musculoskeletal: trace edema bilaterally, no cyanosis, no clubbing  CNS: AAOX3, no focal deficit, CN intact.  Discharge Instructions   Discharge Instructions    Discharge instructions   Complete by:  As directed    Keep oxygen supplementation to assist with shortness of breath. Take medications as instructed Maintain adequate hydration Follow  instructions and recommendations by hospice services at home. Outpatient follow-up with urologist to further evaluation and treatment determination for bladder outlet obstruction and possibility of Foley catheter removal.     Allergies as of 11/26/2017      Reactions   Talc Itching, Rash      Medication List    STOP taking these medications   ASPIRIN ADULT PO     TAKE these medications   acetaminophen 325 MG tablet Commonly known as:  TYLENOL Take 2 tablets (650 mg total) by mouth every 6 (six) hours as needed for mild pain (or Fever >/= 101).   iron polysaccharides 150 MG capsule Commonly known as:  NIFEREX Take 1 capsule (150 mg total) by mouth daily. Start taking on:  11/27/2017   LORazepam 0.5 MG tablet Commonly known as:  ATIVAN Take 1 tablet (0.5 mg total) by mouth every 6 (six) hours as needed for anxiety.   metoprolol tartrate 25 MG tablet Commonly known as:  LOPRESSOR Take 0.5 tablets (12.5 mg total) by mouth 2 (two) times daily.   oxyCODONE 5 MG/5ML solution Commonly known as:  ROXICODONE Take 5 mLs (5 mg total) by mouth every 4 (four) hours as needed for moderate pain (or SOB).      Allergies  Allergen Reactions  . Talc Itching and Rash   Follow-up Information    Fran Lowes, MD Follow up in 6 day(s).   Specialty:  Nephrology Why:  Renal panel Contact information: [address] [address] [phone]        [facility]. Schedule an appointment as soon as possible for a visit in 10 day(s).   Why:  bladder outlet obstruction and possibility of removing foley catheter  Contact information: [address] [place] [address] [phone]       [name], MD. Schedule an appointment as soon as possible for a visit in 1 week(s).   Specialty:  Family Medicine Contact information: [address] [place] [address] [phone]            The results of significant  diagnostics from this hospitalization (including imaging, microbiology, ancillary and laboratory) are listed below for reference.    Significant Diagnostic Studies: Ct Abdomen Pelvis Wo Contrast  Result Date: 11/23/2017 CLINICAL DATA:  Generalized weakness with nausea and shortness of breath over the last 3 weeks. EXAM: CT ABDOMEN AND PELVIS WITHOUT CONTRAST TECHNIQUE: Multidetector CT imaging of the abdomen and pelvis was performed following the standard protocol without IV contrast. COMPARISON:  Radiography same day. FINDINGS: Lower chest: Moderate to large bilateral effusions layering dependently with dependent pulmonary atelectasis and or infiltrate. Hepatobiliary: No liver parenchymal lesion is seen. The liver is somewhat small raising the possibility of cirrhosis or previous liver injury. There are a few calcified gallstones within the gallbladder. No sign of biliary obstruction or cholecystitis by CT. Pancreas: Normal Spleen: Normal Adrenals/Urinary Tract: Adrenal glands are normal. Renal parenchymal tissue is normal on each side. Patient has bilateral hydroureteronephrosis. The bladder is markedly distended in this is probably secondary to that. Prostate gland appears enlarged. Stomach/Bowel: No active bowel pathology is seen. Diverticulosis without evidence of diverticulitis. Vascular/Lymphatic: Aortic atherosclerosis. No aneurysm. IVC is normal. No retroperitoneal adenopathy or mass. Reproductive: Enlarged prostate as noted above.  Otherwise negative. Other: No free fluid or air. Musculoskeletal: Ordinary degenerative changes affect the lumbar spine. No evidence of advanced disease or apparent spinal stenosis. IMPRESSION: Moderate to large bilateral effusions layering dependently with dependent atelectasis and or infiltrate. Distended urinary bladder. Bilateral hydroureteronephrosis, likely secondary to that. Enlarged prostate. Aortic atherosclerosis. Electronically Signed   By: Nelson Chimes M.D.   On:  11/23/2017 21:00   Dg Chest 1 View  Result Date: 11/24/2017 CLINICAL DATA:  RIGHT pleural effusion post thoracentesis EXAM: CHEST  1 VIEW COMPARISON:  11/23/2017 FINDINGS: Enlargement of cardiac silhouette. Mediastinal contour stable with atherosclerotic calcification of aorta noted. Decreased RIGHT pleural effusion and basilar atelectasis post thoracentesis. Persistent LEFT pleural effusion and basilar atelectasis. Question minimal perihilar edema. No pneumothorax following thoracentesis. Bones demineralized. IMPRESSION: No pneumothorax following RIGHT thoracentesis. Decrease in RIGHT pleural effusion and basilar atelectasis since 11/23/2017 with persistent LEFT pleural effusion and basilar atelectasis. Improved pulmonary edema. Electronically Signed   By: Lavonia Dana M.D.   On: 11/24/2017 15:11   Dg Abd Acute W/chest  Result Date: 11/23/2017 CLINICAL DATA:  Generalize weakness, shortness of breath, and nausea for the past 3 weeks. Patient reports a near syncopal episode this morning. The patient has orthostasis. 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 EXAM: DG ABDOMEN ACUTE W/ 1V CHEST COMPARISON:  None in PACs FINDINGS: The lungs are adequately inflated. There small bilateral pleural effusions. There is no pneumothorax. The interstitial markings are coarse and become confluent at the lung bases. The heart is normal in size. The pulmonary vascularity is not engorged. There is calcification in the wall of  the aortic arch. Within the abdomen there are loops of mildly distended gas-filled small bowel in the upper abdomen. On is small amount of colonic gas is present. There is some gas and stool in the rectum. No free extraluminal gas collections are observed. There are no abnormal soft tissue calcifications. IMPRESSION: The bowel gas pattern suggests a gastroenteritis or ileus type pattern. There is no evidence of perforation or high-grade obstruction. Interstitial  changes throughout both lungs which may be acute or chronic. There is bibasilar atelectasis or pneumonia. There are small bilateral pleural effusions of uncertain age. The heart and pulmonary vascularity are within the limits of normal. Thoracic aortic atherosclerosis. Electronically Signed   By: David  Martinique M.D.   On: 11/23/2017 16:53   US Thoracentesis Asp Pleural Space W/img Guide  Result Date: 11/24/2017 INDICATION: BILATERAL pleural effusions, for diagnostic and therapeutic RIGHT thoracentesis EXAM: ULTRASOUND GUIDED DIAGNOSTIC AND THERAPEUTIC RIGHT THORACENTESIS MEDICATIONS: None. COMPLICATIONS: None immediate. PROCEDURE: Procedure, benefits, and risks of procedure were discussed with patient. Written informed consent for procedure was obtained. Time out protocol followed. Pleural effusion localized by ultrasound at the posterior RIGHT hemithorax. Skin prepped and draped in usual sterile fashion. Skin and soft tissues anesthetized with 10 mL of 1% lidocaine. 8 French thoracentesis catheter placed into the RIGHT pleural space. 1.3 L of serosanguineous to amber colored RIGHT pleural fluid aspirated by syringe pump. Procedure tolerated well by patient without immediate complication. FINDINGS: A total of approximately 1.3 L of RIGHT pleural fluid was removed. Samples were sent to the laboratory as requested by the clinical team. IMPRESSION: Successful ultrasound guided RIGHT thoracentesis yielding 1.3 L of pleural fluid. Electronically Signed   By: Lavonia Dana M.D.   On: 11/24/2017 15:14    Microbiology: Recent Results (from the past 240 hour(s))  Culture, body fluid-bottle     Status: None (Preliminary result)   Collection Time: 11/24/17  2:43 PM  Result Value Ref Range Status   Specimen Description FLUID RIGHT THORACENTESIS  Final   Special Requests BOTTLES DRAWN AEROBIC AND ANAEROBIC 10CC EACH  Final   Culture   Final    NO GROWTH 2 DAYS Performed at [facility], [address]  [place], [place] [address]    Report Status PENDING  Incomplete  Gram stain     Status: None   Collection Time: 11/24/17  2:43 PM  Result Value Ref Range Status   Specimen Description FLUID RIGHT THORACENTESIS  Final   Special Requests NONE  Final   Gram Stain   Final    CYTOSPIN SMEAR WBC PRESENT, PREDOMINANTLY MONONUCLEAR NO ORGANISMS SEEN Performed at [facility] Performed at [facility], [address], [place], [place] [address]    Report Status 11/24/2017 FINAL  Final     Labs: Basic Metabolic Panel: Recent Labs  Lab 11/23/17 1528 11/24/17 0211 11/25/17 0431 11/26/17 0619  NA 139 140 141  141 140  K 3.2* 3.0* 3.2*  3.5 3.4*  CL 99* 100* 102  102 102  CO2 29 27 27  29 27   GLUCOSE 105* 122* 97  98 99  BUN 37* 39* 31*  31* 26*  CREATININE 4.59* 4.83* 3.46*  3.49* 2.48*  CALCIUM 8.7* 8.3* 8.1*  8.2* 7.9*  MG  --   --   --  1.5*  PHOS  --   --  3.7  --    Liver Function Tests: Recent Labs  Lab 11/23/17 1601 11/24/17 0211 11/25/17 0431  AST 26 25  --  ALT 18 15*  --   ALKPHOS 50 47  --   BILITOT 0.8 0.8  --   PROT 7.0 6.6  --   ALBUMIN 3.2* 2.9* 2.7*   Recent Labs  Lab 11/23/17 1601  LIPASE 27   CBC: Recent Labs  Lab 11/23/17 1528  11/24/17 0211  11/24/17 1213 11/24/17 1802 11/24/17 2359 11/25/17 0431 11/26/17 0619  WBC 9.2  --  7.7  --   --   --   --  8.6 7.5  NEUTROABS 6.6  --   --   --   --   --   --   --   --   HGB 9.2*  --  8.7*   < > 9.2* 9.3* 9.2* 8.4* 8.8*  HCT 29.4*   < > 27.2*   < > 28.9* 29.2* 29.2* 27.1* 28.8*  MCV 92.2  --  91.9  --   --   --   --  92.8 93.2  PLT 203  --  194  --   --   --   --  201 176   < > = values in this interval not displayed.   Cardiac Enzymes: Recent Labs  Lab 11/23/17 1528 11/23/17 2109 11/24/17 0211 11/24/17 0637  TROPONINI 0.17* 0.15* 0.14* 0.12*   BNP: BNP (last 3 results) Recent Labs    11/23/17 1601  BNP 1,042.0*    Signed:  Barton Dubois MD.  [facility] 11/26/2017,  3:23 PM

## 2017-11-29 LAB — CULTURE, BODY FLUID W GRAM STAIN -BOTTLE: Culture: NO GROWTH

## 2017-12-02 ENCOUNTER — Telehealth: Payer: Self-pay | Admitting: Family Medicine

## 2017-12-02 NOTE — Telephone Encounter (Signed)
Amy, [facility], called regarding patient. She states the family is requesting a steroid to stimulate appetite as he can not eat at all anymore. She states they would like to make it where he can enjoy eating just a little longer. Callback# [phone]

## 2017-12-02 NOTE — Telephone Encounter (Signed)
Called back number busy

## 2017-12-02 NOTE — Telephone Encounter (Signed)
Please advise that I do not use steroid to stimulate appetite b/c it can have so many other side effects related to fluid retention and other side effects. If patient does not want to eat or does not have an appetite, he does not have to eat. I know that it can be difficult for the family when a patient does not eat. I could use remeron for appetite and sleep but I will not use the steroid. Please advise and let me know. [name], MD

## 2017-12-03 NOTE — Telephone Encounter (Signed)
I also called, the number was busy both attempts. I called the Hospice home in [place] and they are reaching out to Amy to call us back regarding this.

## 2017-12-23 DEATH — deceased

## 2017-12-25 ENCOUNTER — Ambulatory Visit: Payer: Medicare Other | Admitting: Family Medicine

## 2018-01-26 IMAGING — US US THYROID
1 series · 13 of 25 positions shown · non-contrast
Comparison: None.

CLINICAL DATA: Incidental on CT. Left thyroid mass noted on CT
measures 4.2 x 5.0 cm

EXAM:
THYROID ULTRASOUND
TECHNIQUE: Ultrasound examination of the thyroid gland and adjacent soft
tissues was performed.

[Series 1: us thyroid · 0.06mm/px · 13 of 53 slices shown]
[im 1/53]
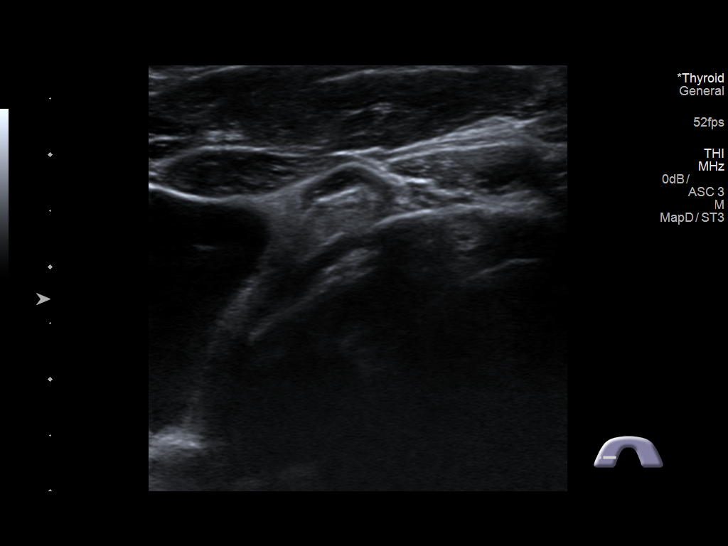
[im 5/53]
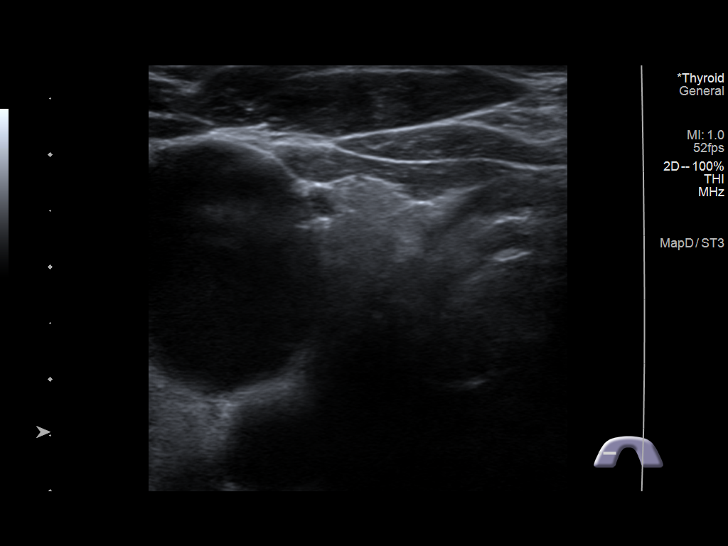
[im 9/53]
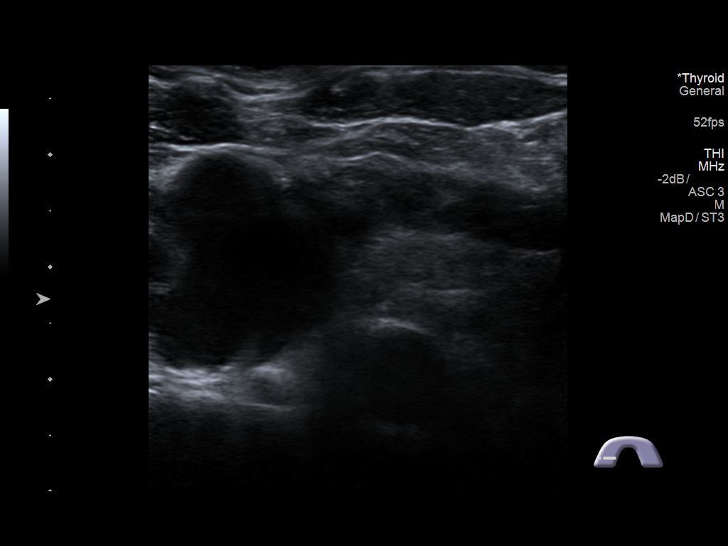
[im 14/53]
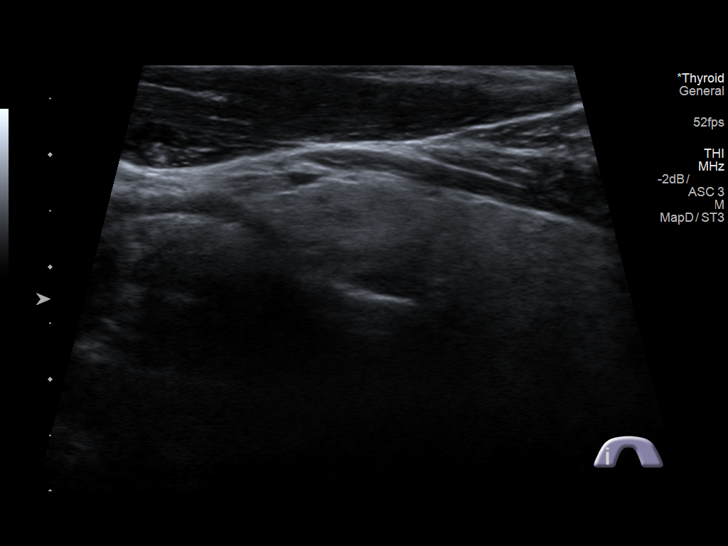
[im 18/53]
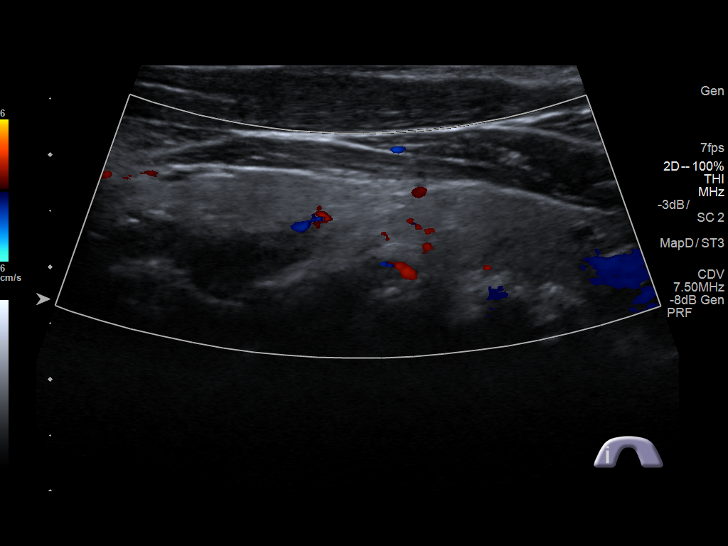
[im 22/53]
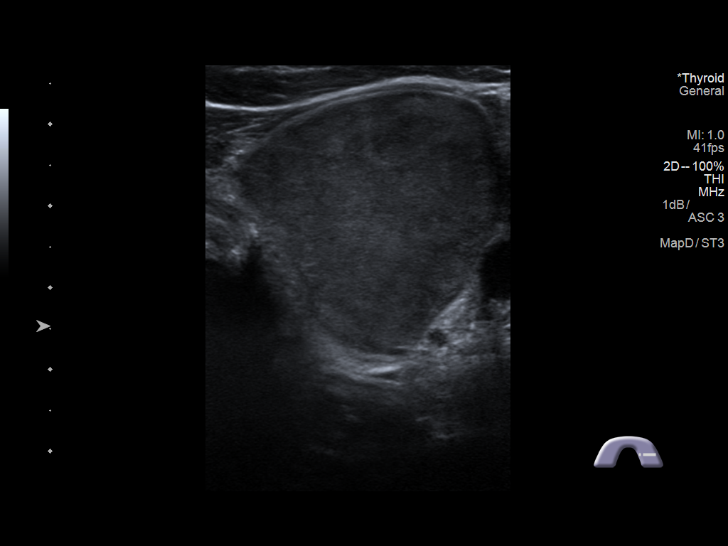
[im 27/53]
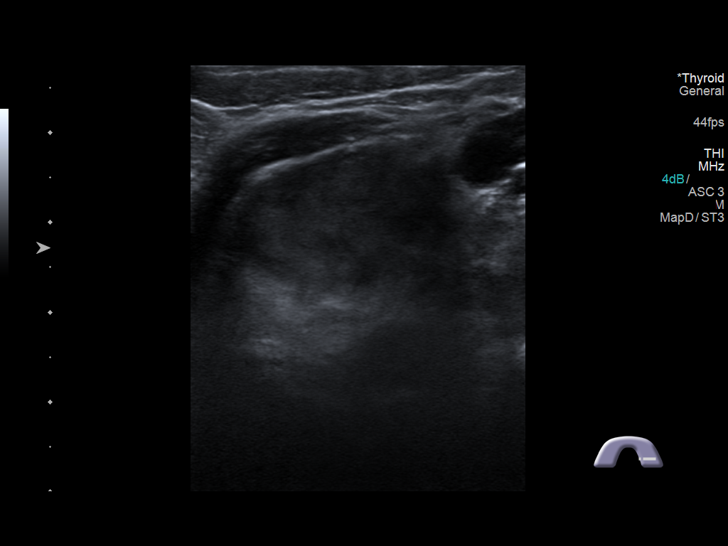
[im 31/53]
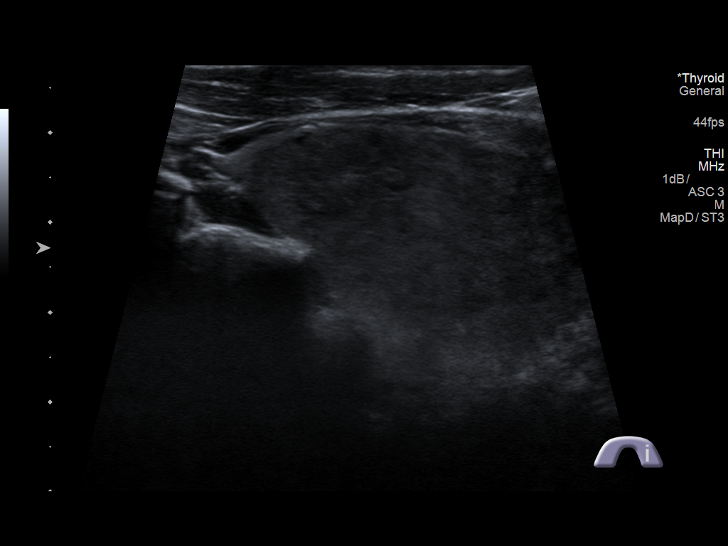
[im 35/53]
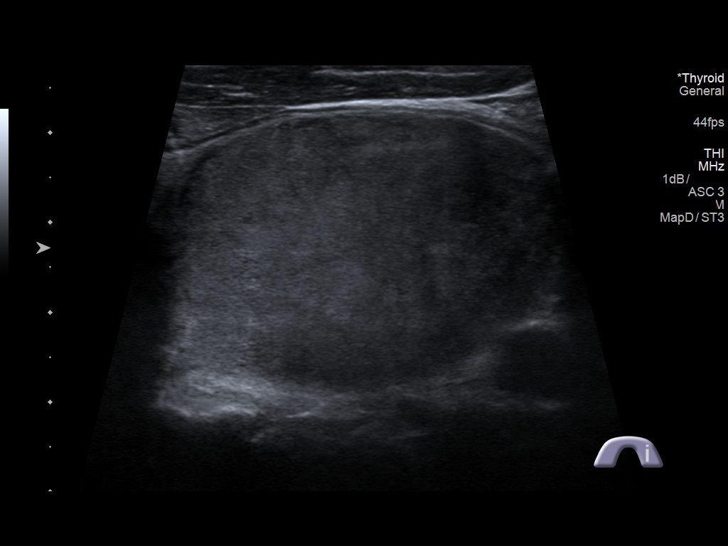
[im 40/53]
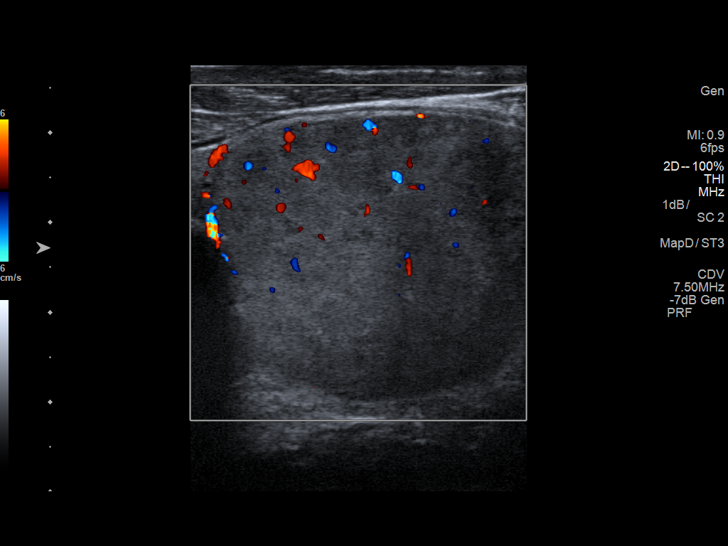
[im 44/53]
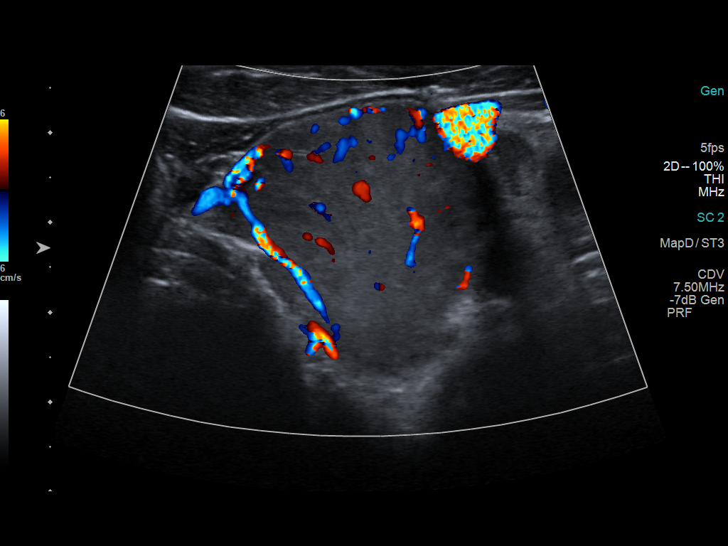
[im 48/53]
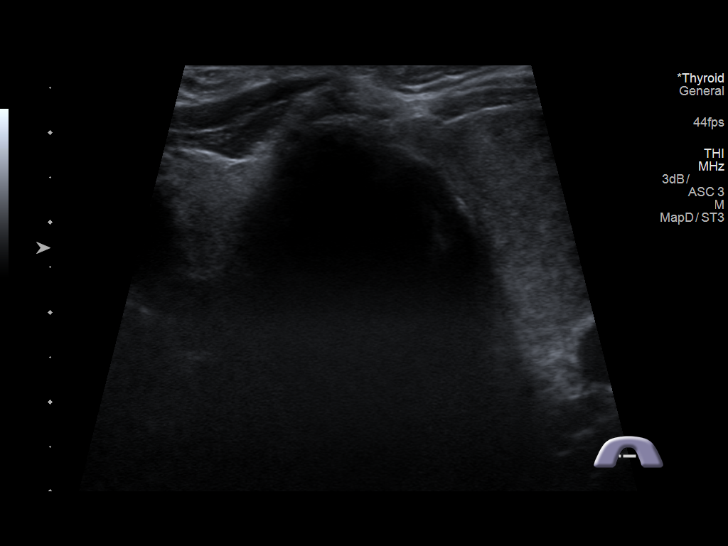
[im 53/53]
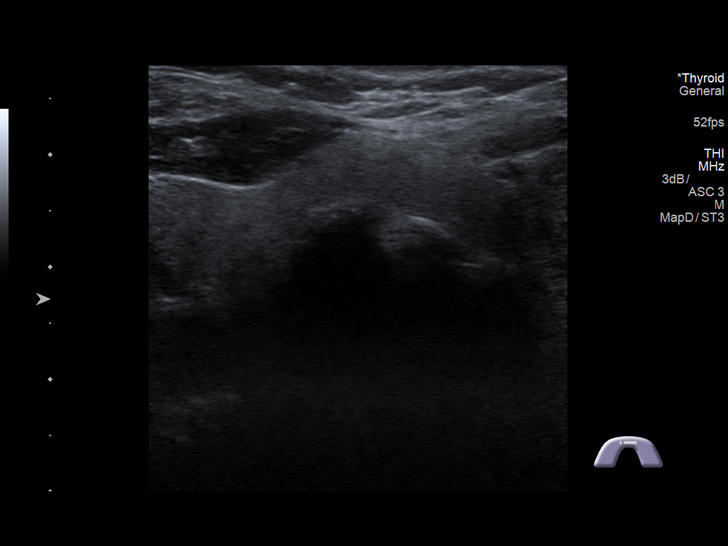

[13 of 25 positions shown; findings below may reference images not displayed]

FINDINGS: Parenchymal Echotexture: Mildly heterogenous

Isthmus: 0.6 cm

Right lobe: 4.2 x 1.3 x 1.2 cm

Left lobe: 5.0 x 3.7 x 3.6 cm

_________________________________________________________

Estimated total number of nodules >/= 1 cm: 1

Number of spongiform nodules >/=  2 cm not described below (TR1): 0

Number of mixed cystic and solid nodules >/= 1.5 cm not described
below (TR2): 0

_________________________________________________________

Nodule # 1:

Location: Left; Mid

Maximum size: 4.2 cm; Other 2 dimensions: 3.4 x 3.2 cm

Composition: solid/almost completely solid (2)

Echogenicity: hypoechoic (2)

Shape: not taller-than-wide (0)

Margins: smooth (0)

Echogenic foci: none (0)

ACR TI-RADS total points: 4.

ACR TI-RADS risk category: TR4 (4-6 points).

ACR TI-RADS recommendations:

**Given size (>/= 1.5 cm) and appearance, fine needle aspiration of
this moderately suspicious nodule should be considered based on
TI-RADS criteria.

_________________________________________________________
IMPRESSION: Solitary left lobe nodule 1 meets criteria for fine needle
aspiration biopsy and corresponds to the abnormality noted on the CT
neck.

The above is in keeping with the ACR TI-RADS recommendations - [HOSPITAL] 7329;[DATE].

## 2018-01-28 ENCOUNTER — Encounter: Payer: Self-pay | Admitting: Family Medicine

## 2018-01-28 IMAGING — US US THYROID BIOPSY
1 series · 9 of 9 positions shown · non-contrast
Comparison: US Thyroid 07/06/17

MEDICATIONS:
5 cc 1% lidocaine

COMPLICATIONS:
None immediate.

INDICATION: Indeterminate thyroid nodule

Left mid thyroid nodule; 4.2 cm
EXAM:
ULTRASOUND GUIDED FINE NEEDLE ASPIRATION OF INDETERMINATE THYROID
NODULE
TECHNIQUE: Informed written consent was obtained from the patient after a
discussion of the risks, benefits and alternatives to treatment.
Questions regarding the procedure were encouraged and answered. A
timeout was performed prior to the initiation of the procedure.

[Series 1: us thyroid biopsy · 0.08mm/px · 9 acquisitions, 9 frames shown]
[im 1/9]
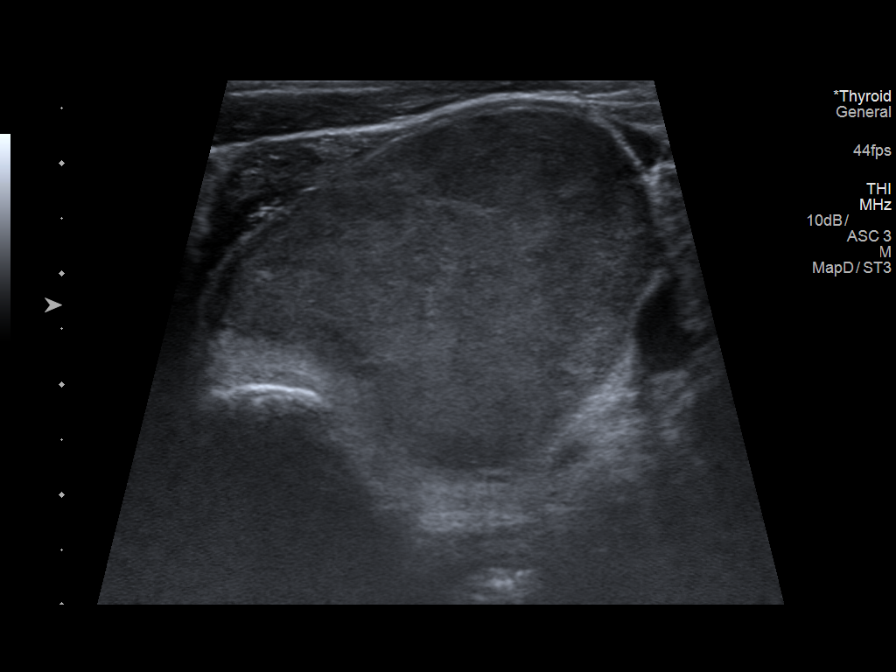
[im 2/9]
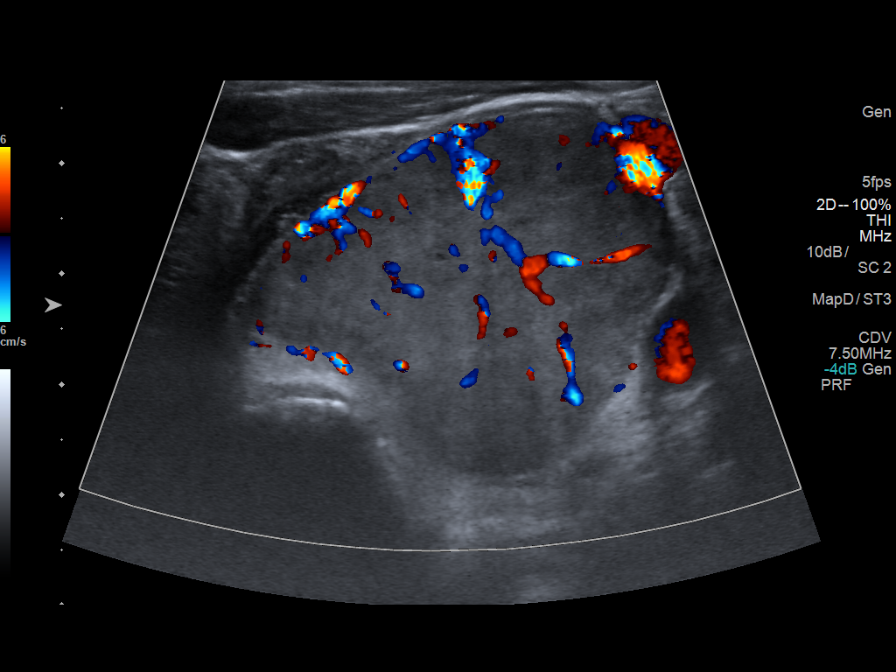
[im 3/9]
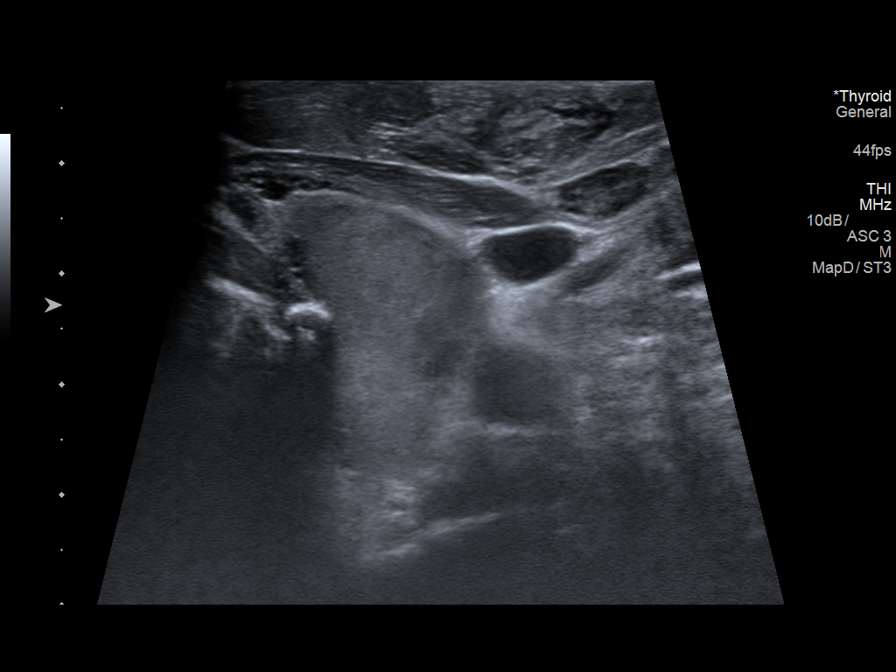
[im 4/9]
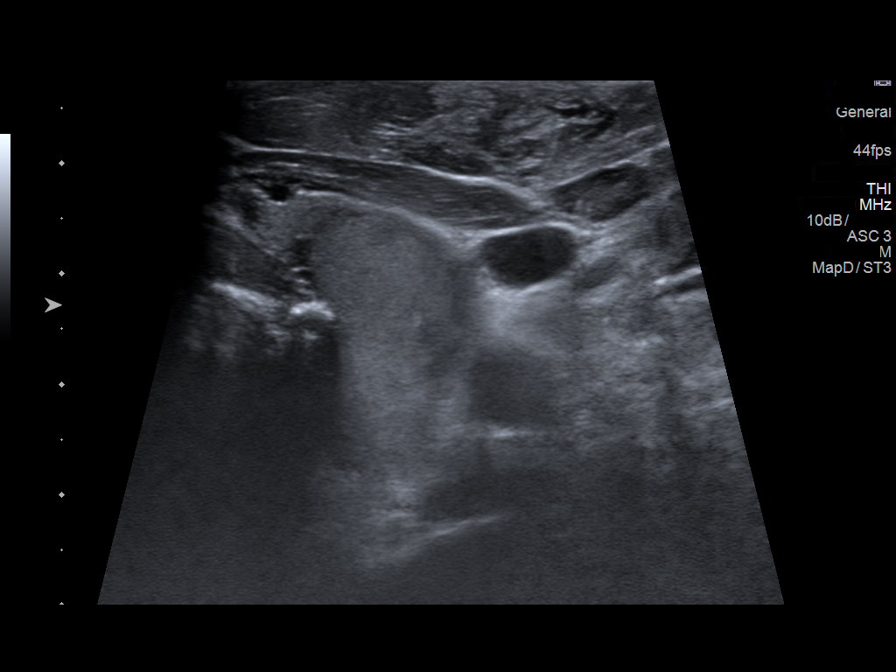
[im 5/9]
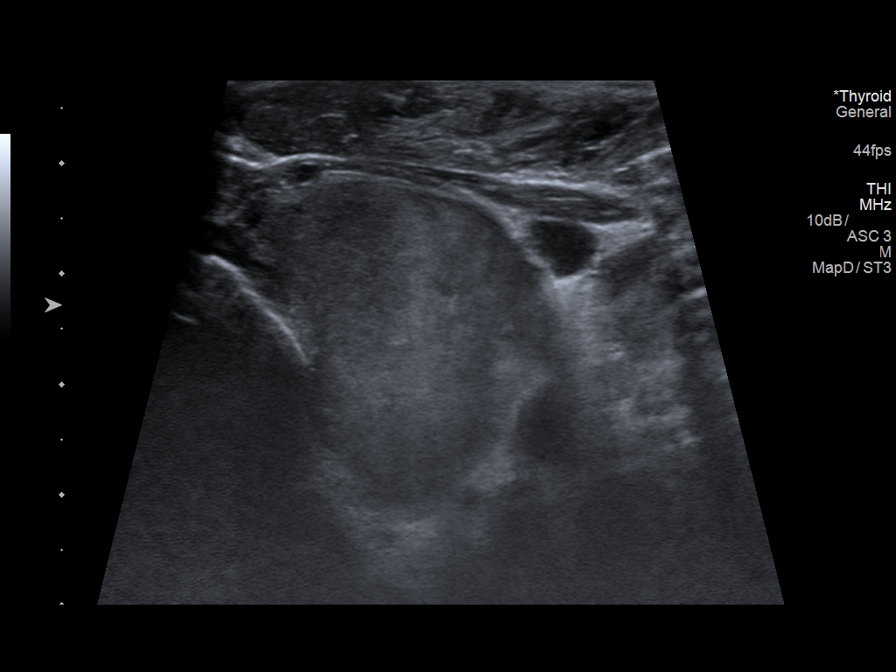
[im 6/9]
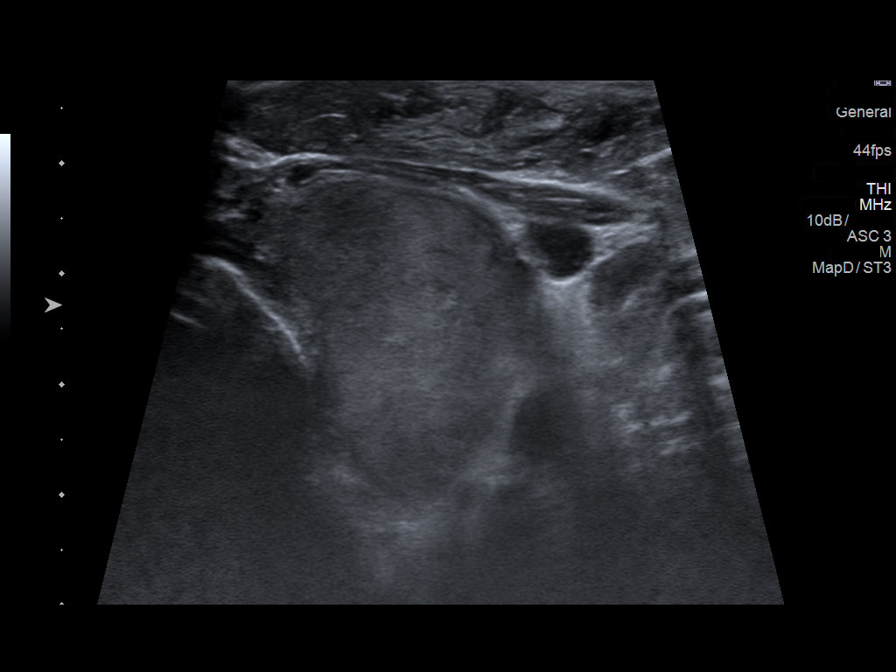
[im 7/9]
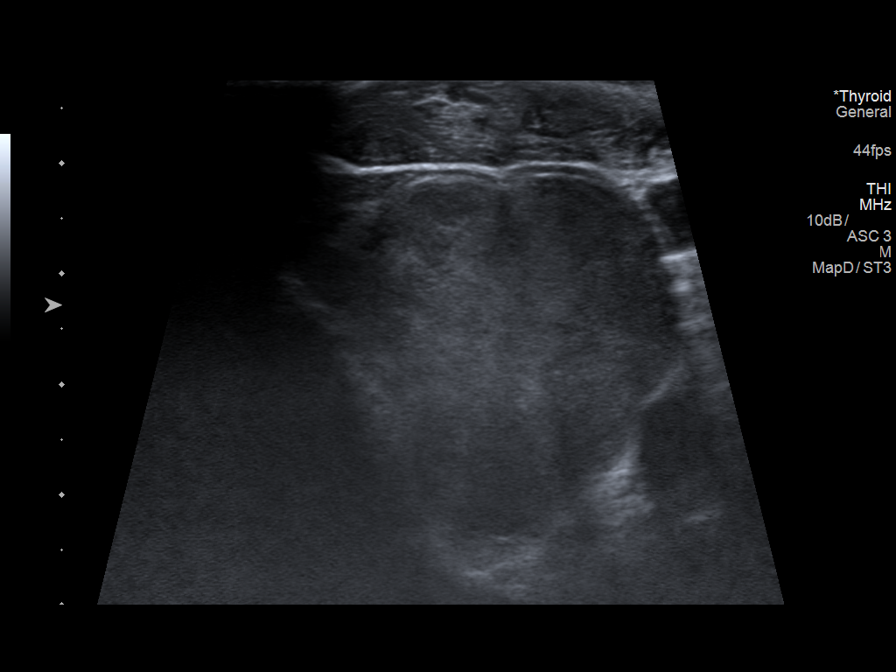
[im 8/9]
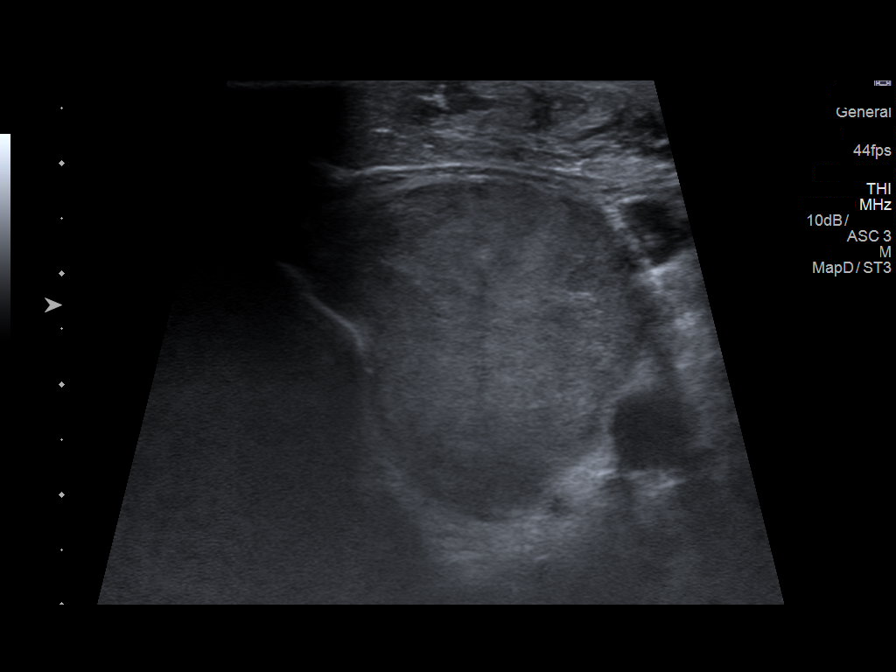
[im 9/9]
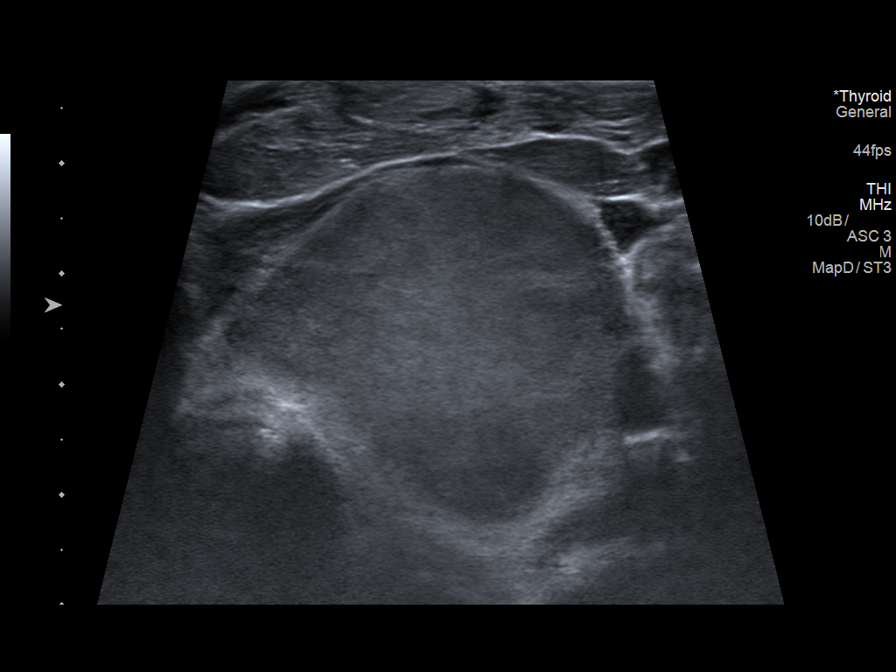

[9 of 9 positions shown; findings below may reference images not displayed]

Pre-procedural ultrasound scanning demonstrated unchanged size and
appearance of the indeterminate nodule within the left thyroid

The procedure was planned. The neck was prepped in the usual sterile
fashion, and a sterile drape was applied covering the operative
field. A timeout was performed prior to the initiation of the
procedure. Local anesthesia was provided with 1% lidocaine.

Under direct ultrasound guidance, 3 FNA biopsies were performed of
the left thyroid nodule with a 25 gauge needle. Multiple ultrasound
images were saved for procedural documentation purposes. The samples
were prepared and submitted to pathology.

Limited post procedural scanning was negative for hematoma or
additional complication. Dressings were placed. The patient
tolerated the above procedures procedure well without immediate
postprocedural complication.
FINDINGS: Nodule reference number based on prior diagnostic ultrasound: 1

Maximum size:  4.2 cm

Location: Left; Mid

ACR TI-RADS risk category: TR4 (4-6 points)

Reason for biopsy: meets ACR TI-RADS criteria

Ultrasound imaging confirms appropriate placement of the needles
within the thyroid nodule.
IMPRESSION: Technically successful ultrasound guided fine needle aspiration of
left thyroid nodule.

Read by

Ka Hang Nalang

## 2018-01-29 ENCOUNTER — Encounter: Payer: Self-pay | Admitting: Family Medicine

## 2018-08-18 IMAGING — CT CT NECK W/ CM
3 of 14 series · 7 of 33 positions shown, 8 images · IV contrast (iopamidol)
Comparison: Head CT same day

CLINICAL DATA: Left orbital melanoma.  Biopsy done 6 days ago.

EXAM:
CT NECK WITH CONTRAST
TECHNIQUE: Multidetector CT imaging of the neck was performed using the
standard protocol following the bolus administration of intravenous
contrast.
CONTRAST:  75mL JAIG6O-2KK IOPAMIDOL (JAIG6O-[id number]) INJECTION 61%

[Series 14: axial neck · axial · 0.54mm/px · z∈[-46,+24]mm · 2 of 107 slices shown]
[im 36/107  bone]
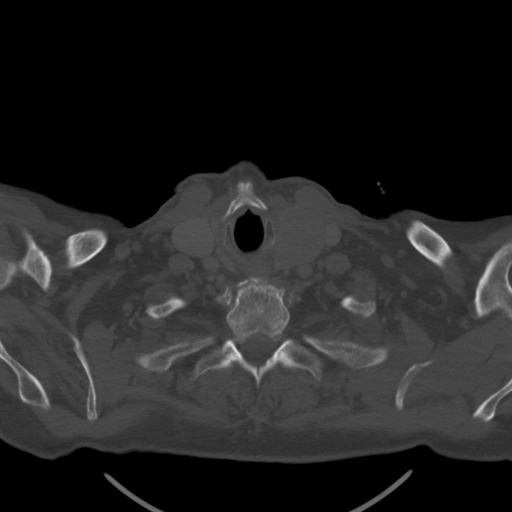
[im 71/107  bone]
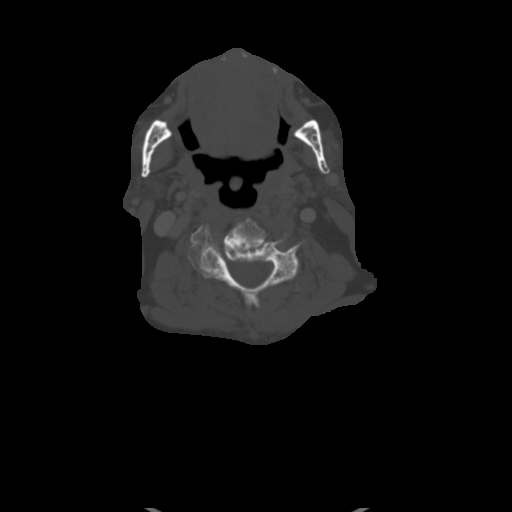

[Series 23: sagittal neck · sagittal · 0.42mm/px · 2 of 101 slices shown]
[im 34/101  bone]
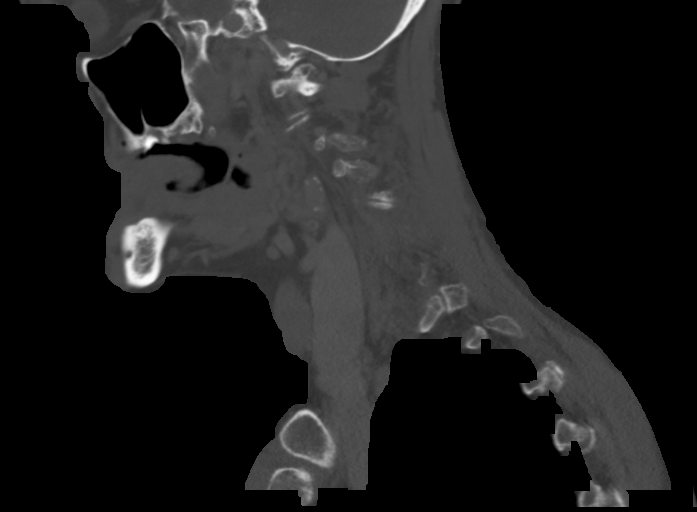
[im 67/101  bone]
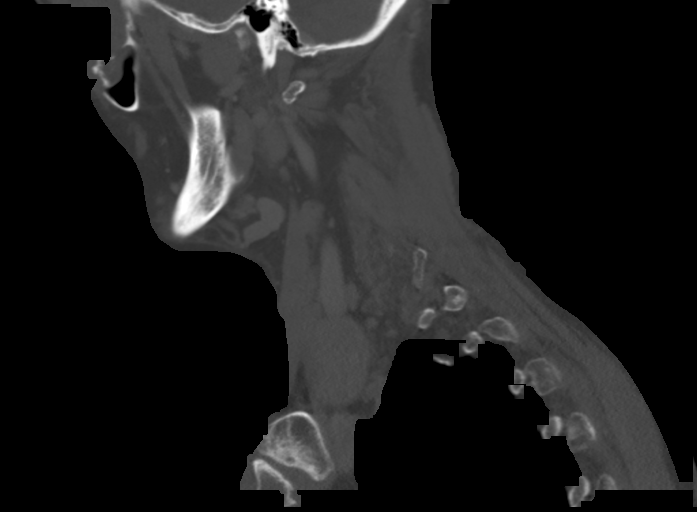

[Series 24: orthogonal ax · axial · 0.39mm/px · z∈[-114,+23]mm · 3 of 147 slices shown, 4 images]
[im 37/147  soft-tissue]
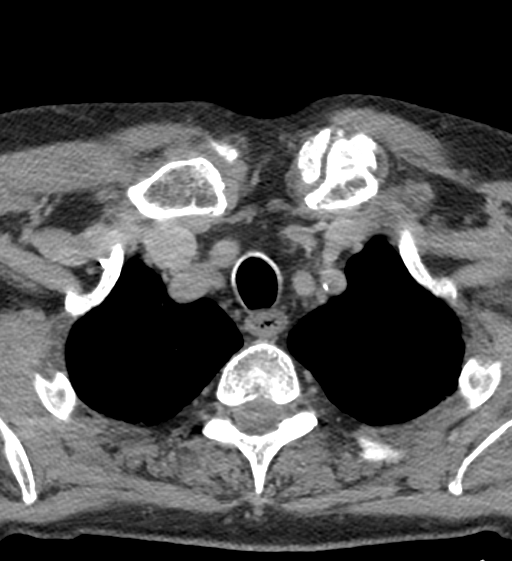
[im 37/147  bone]
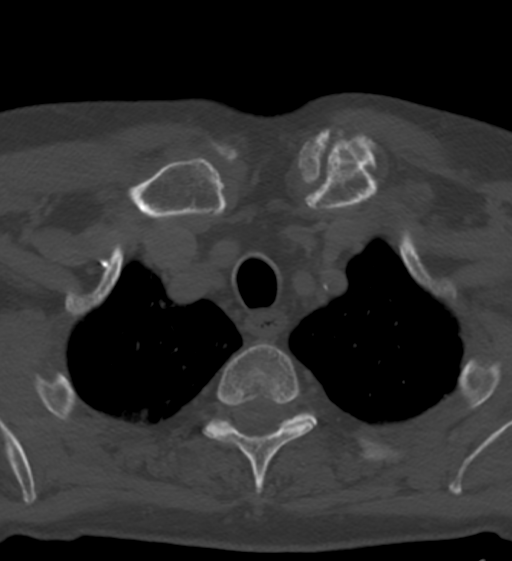
[im 74/147  bone]
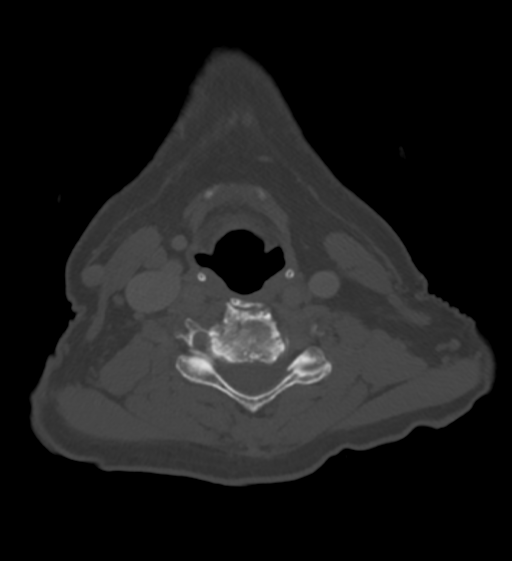
[im 110/147  bone]
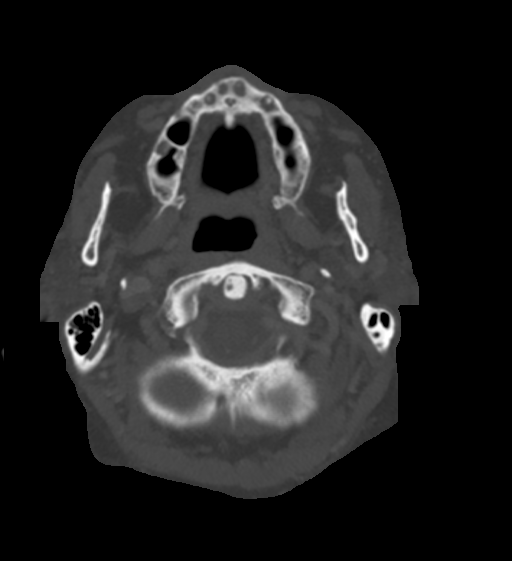

[7 of 33 positions shown; findings below may reference images not displayed]

FINDINGS: Pharynx and larynx: No mucosal or submucosal lesion.

Salivary glands: Parotid and submandibular glands are normal.

Thyroid: Right lobe of the thyroid appears normal. Dominant mass
involving the left thyroid lobe with a diameter of 4.2 cm extending
over a length of 5 cm. This could be a primary thyroid neoplasm or
conceivably a metastasis. Thyroid ultrasound would be suggested,
probably with biopsy if appropriate given the overall clinical
condition.

Lymph nodes: No pathologically enlarged nodes on either side of the
neck.

Vascular: Arterial and venous structures are patent. Some
atherosclerotic change at the carotid bifurcations.

Limited intracranial: See results of head CT.

Visualized orbits: As noted at the head CT, there is extensive
orbital tumor on the left. This examination extends lower than the
head CT, showing tumor extension into the anterior superior
maxillary sinus and anterior to the zygoma.

Mastoids and visualized paranasal sinuses: Sinuses otherwise
negative.

Skeleton: Advanced degenerative cervical spondylosis. No evidence of
spinal metastasis. Destruction of the left orbital floor and
inferior orbital room as previously described.

Upper chest: Apical pleural and parenchymal scarring. No nodule
suggestive of metastatic disease in the upper chest.

Other: None
IMPRESSION: 4.2 cm in diameter left thyroid mass extending over a length of 5
cm. This could be a primary thyroid neoplasm or conceivably a
metastasis. Thyroid ultrasound would be recommended, possibly with
biopsy of clinically appropriate.

No other neck mass. No sign of adenopathy. Normal appearing nodes on
both sides of the neck.

Left orbital tumor as described at head CT, with destruction of the
orbital floor and inferior orbital rim and extension into the
anterior and superior left maxillary sinus.
